# Patient Record
Sex: Female | Born: 1995 | Race: Black or African American | Hispanic: No | Smoking: Never smoker
Health system: Southern US, Community
[De-identification: ages and names within clinical notes are randomized; demographics above are authoritative.]

## PROBLEM LIST (undated history)

## (undated) ENCOUNTER — Ambulatory Visit: Admission: EM | Payer: BC Managed Care – PPO | Source: Home / Self Care

## (undated) DIAGNOSIS — F53 Postpartum depression: Secondary | ICD-10-CM

## (undated) DIAGNOSIS — D649 Anemia, unspecified: Secondary | ICD-10-CM

## (undated) HISTORY — PX: WISDOM TOOTH EXTRACTION: SHX21

## (undated) HISTORY — DX: Anemia, unspecified: D64.9

## (undated) HISTORY — DX: Postpartum depression: F53.0

---

## 2017-06-05 ENCOUNTER — Ambulatory Visit (INDEPENDENT_AMBULATORY_CARE_PROVIDER_SITE_OTHER): Payer: BLUE CROSS/BLUE SHIELD | Admitting: Family Medicine

## 2017-06-05 ENCOUNTER — Encounter: Payer: Self-pay | Admitting: Family Medicine

## 2017-06-05 VITALS — BP 120/82 | HR 94 | Temp 98.7°F | Resp 18 | Ht 64.49 in | Wt 131.4 lb

## 2017-06-05 DIAGNOSIS — J029 Acute pharyngitis, unspecified: Secondary | ICD-10-CM

## 2017-06-05 DIAGNOSIS — N926 Irregular menstruation, unspecified: Secondary | ICD-10-CM | POA: Diagnosis not present

## 2017-06-05 LAB — POCT URINE PREGNANCY: Preg Test, Ur: NEGATIVE

## 2017-06-05 LAB — POCT RAPID STREP A (OFFICE): Rapid Strep A Screen: POSITIVE — AB

## 2017-06-05 MED ORDER — AMOXICILLIN 500 MG PO CAPS: 500.0000 mg | ORAL_CAPSULE | Freq: Two times a day (BID) | ORAL | 0 refills | Status: AC

## 2017-06-05 MED ORDER — AMOXICILLIN 500 MG PO CAPS: 500.0000 mg | ORAL_CAPSULE | Freq: Two times a day (BID) | ORAL | 0 refills | Status: DC

## 2017-06-05 NOTE — Progress Notes (Signed)
   9/6/20183:56 PM  Cynthia Waters 12/24/1995, 21 y.o. female [id number]  Chief Complaint  Patient presents with  . Sore Throat    X 2 days    HPI:   Patient is a 21 y.o. female with past medical history significant for recurring strep  throat who presents today for 2 days of sore throat, no fever, no cough.   Also reports her period is late by about 3 days despite taking her OCPs as rx. She denies missing any pills.   Depression screen PHQ 2/9 06/05/2017  Decreased Interest 0  Down, Depressed, Hopeless 0  PHQ - 2 Score 0    No Known Allergies  No current outpatient prescriptions on file prior to visit.   No current facility-administered medications on file prior to visit.     History reviewed. No pertinent past medical history.  Past Surgical History:  Procedure Laterality Date  . WISDOM TOOTH EXTRACTION      Social History  Substance Use Topics  . Smoking status: Never Smoker  . Smokeless tobacco: Never Used  . Alcohol use No    History reviewed. No pertinent family history.  Review of Systems  Constitutional: Negative for chills and fever.  HENT: Positive for sore throat. Negative for congestion, ear pain and sinus pain.   Respiratory: Negative for cough and shortness of breath.   Gastrointestinal: Negative for abdominal pain, nausea and vomiting.     OBJECTIVE:  Blood pressure 120/82, pulse 94, temperature 98.7 F (37.1 C), temperature source Oral, resp. rate 18, height 5' 4.49" (1.638 m), weight 131 lb 6.4 oz (59.6 kg), last menstrual period 05/01/2017, SpO2 98 %.  Physical Exam  Constitutional: She is oriented to person, place, and time and well-developed, well-nourished, and in no distress.  HENT:  Head: Normocephalic and atraumatic.  Right Ear: Hearing, tympanic membrane, external ear and ear canal normal.  Left Ear: Hearing, tympanic membrane, external ear and ear canal normal.  Mouth/Throat: Mucous membranes are normal. Oropharyngeal exudate  present.  Eyes: Pupils are equal, round, and reactive to light. EOM are normal.  Neck: Neck supple.  Cardiovascular: Normal rate, regular rhythm and normal heart sounds.  Exam reveals no gallop and no friction rub.   No murmur heard. Pulmonary/Chest: Effort normal and breath sounds normal. She has no wheezes. She has no rales.  Lymphadenopathy:    She has cervical adenopathy.  Neurological: She is alert and oriented to person, place, and time. Gait normal.  Skin: Skin is warm and dry.    Results for orders placed or performed in visit on 06/05/17  POCT rapid strep A  Result Value Ref Range   Rapid Strep A Screen Positive (A) Negative  POCT urine pregnancy  Result Value Ref Range   Preg Test, Ur Negative Negative     ASSESSMENT and PLAN:  1. Sore throat Rapid strep test positive. Rx for amoxicillin sent to pharmacy of choice. Routine instructions and precautions discussed.  - POCT rapid strep A  2. Missed period Negative pregnancy test. Advised repeating in 1 week if still has not had her menses. - POCT urine pregnancy       Cynthia LippsIrma M Santiago, MD [facility] [address] [place], [place] [address] Ph.  [phone] Fax [phone]

## 2017-06-05 NOTE — Patient Instructions (Addendum)
   IF you received an x-ray today, you will receive an invoice from [facility]. Please contact [facility] at [phone] with questions or concerns regarding your invoice.   IF you received labwork today, you will receive an invoice from [facility]. Please contact [facility] at [phone] with questions or concerns regarding your invoice.   Our billing staff will not be able to assist you with questions regarding bills from these companies.  You will be contacted with the lab results as soon as they are available. The fastest way to get your results is to activate your My Chart account. Instructions are located on the last page of this paperwork. If you have not heard from us regarding the results in 2 weeks, please contact this office.     Strep Throat Strep throat is a bacterial infection of the throat. Your health care provider may call the infection tonsillitis or pharyngitis, depending on whether there is swelling in the tonsils or at the back of the throat. Strep throat is most common during the cold months of the year in children who are 5-15 years of age, but it can happen during any season in people of any age. This infection is spread from person to person (contagious) through coughing, sneezing, or close contact. What are the causes? Strep throat is caused by the bacteria called Streptococcus pyogenes. What increases the risk? This condition is more likely to develop in:  People who spend time in crowded places where the infection can spread easily.  People who have close contact with someone who has strep throat.  What are the signs or symptoms? Symptoms of this condition include:  Fever or chills.  Redness, swelling, or pain in the tonsils or throat.  Pain or difficulty when swallowing.  White or yellow spots on the tonsils or throat.  Swollen, tender glands in the neck or under the jaw.  Red rash all over the body (rare).  How is this  diagnosed? This condition is diagnosed by performing a rapid strep test or by taking a swab of your throat (throat culture test). Results from a rapid strep test are usually ready in a few minutes, but throat culture test results are available after one or two days. How is this treated? This condition is treated with antibiotic medicine. Follow these instructions at home: Medicines  Take over-the-counter and prescription medicines only as told by your health care provider.  Take your antibiotic as told by your health care provider. Do not stop taking the antibiotic even if you start to feel better.  Have family members who also have a sore throat or fever tested for strep throat. They may need antibiotics if they have the strep infection. Eating and drinking  Do not share food, drinking cups, or personal items that could cause the infection to spread to other people.  If swallowing is difficult, try eating soft foods until your sore throat feels better.  Drink enough fluid to keep your urine clear or pale yellow. General instructions  Gargle with a salt-water mixture 3-4 times per day or as needed. To make a salt-water mixture, completely dissolve -1 tsp of salt in 1 cup of warm water.  Make sure that all household members wash their hands well.  Get plenty of rest.  Stay home from school or work until you have been taking antibiotics for 24 hours.  Keep all follow-up visits as told by your health care provider. This is important. Contact a health care provider   if:  The glands in your neck continue to get bigger.  You develop a rash, cough, or earache.  You cough up a thick liquid that is green, yellow-brown, or bloody.  You have pain or discomfort that does not get better with medicine.  Your problems seem to be getting worse rather than better.  You have a fever. Get help right away if:  You have new symptoms, such as vomiting, severe headache, stiff or painful neck,  chest pain, or shortness of breath.  You have severe throat pain, drooling, or changes in your voice.  You have swelling of the neck, or the skin on the neck becomes red and tender.  You have signs of dehydration, such as fatigue, dry mouth, and decreased urination.  You become increasingly sleepy, or you cannot wake up completely.  Your joints become red or painful. This information is not intended to replace advice given to you by your health care provider. Make sure you discuss any questions you have with your health care provider. Document Released: 09/13/2000 Document Revised: 05/15/2016 Document Reviewed: 01/09/2015 Elsevier Interactive Patient Education  2017 Elsevier Inc.  

## 2017-06-09 ENCOUNTER — Ambulatory Visit: Payer: BLUE CROSS/BLUE SHIELD | Admitting: Family Medicine

## 2017-07-14 ENCOUNTER — Ambulatory Visit (HOSPITAL_COMMUNITY)
Admission: EM | Admit: 2017-07-14 | Discharge: 2017-07-14 | Disposition: A | Discharge status: Home / Self Care | Payer: Medicaid Other | Attending: Emergency Medicine | Admitting: Emergency Medicine

## 2017-07-14 ENCOUNTER — Encounter (HOSPITAL_COMMUNITY): Payer: Self-pay | Admitting: *Deleted

## 2017-07-14 DIAGNOSIS — M79672 Pain in left foot: Secondary | ICD-10-CM | POA: Diagnosis not present

## 2017-07-14 MED ORDER — NAPROXEN 500 MG PO TABS
500.0000 mg | ORAL_TABLET | Freq: Two times a day (BID) | ORAL | 0 refills | Status: AC
Start: 2017-07-14 — End: 2017-07-24

## 2017-07-14 NOTE — ED Provider Notes (Signed)
[facility]    CSN: [id number] Arrival date & time: 07/14/17  1421     History   Chief Complaint Chief Complaint  Patient presents with  . Foot Pain    HPI Cynthia Waters is a 21 y.o. female.   21 year old female comes in for 3 day history of left heel pain. Denies injury. Patient states pain is located at the medial aspect of the left heel/plantar surface and describes it as a sharp stabbing pain. She states it is worse with walking/standing and after rest. She noticed mild swelling that resolved after elevation. 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 She wears crocs as work with some support. Denies erythema, increased warmth. Has not tried anything for the pain.       History reviewed. No pertinent past medical history.  There are no active problems to display for this patient.   Past Surgical History:  Procedure Laterality Date  . WISDOM TOOTH EXTRACTION      OB History    No data available       Home Medications    Prior to Admission medications   Medication Sig Start Date End Date Taking? Authorizing Provider  norethindrone-ethinyl estradiol 1/35 (ORTHO-NOVUM, NORTREL,CYCLAFEM) tablet Take 1 tablet by mouth daily.   Yes [provider]  naproxen (NAPROSYN) 500 MG tablet Take 1 tablet (500 mg total) by mouth 2 (two) times daily. 07/14/17 07/24/17  Belinda Fisher, PA-C    Family History History reviewed. No pertinent family history.  Social History Social History  Substance Use Topics  . Smoking status: Never Smoker  . Smokeless tobacco: Never Used  . Alcohol use No     Allergies   Patient has no known allergies.   Review of Systems Review of Systems  Reason unable to perform ROS: See HPI as above.     Physical Exam Triage Vital Signs ED Triage Vitals  Enc Vitals Group     BP 07/14/17 1511 109/74     Pulse Rate 07/14/17 1511 60     Resp 07/14/17 1511 17     Temp 07/14/17 1511  98.7 F (37.1 C)     Temp Source 07/14/17 1511 Oral     SpO2 07/14/17 1511 100 %     Weight --      Height --      Head Circumference --      Peak Flow --      Pain Score 07/14/17 1510 7     Pain Loc --      Pain Edu? --      Excl. in GC? --    No data found.   Updated Vital Signs BP 109/74 (BP Location: Left Arm)   Pulse 60   Temp 98.7 F (37.1 C) (Oral)   Resp 17   SpO2 100%   Physical Exam  Constitutional: She is oriented to person, place, and time. She appears well-developed and well-nourished. No distress.  HENT:  Head: Normocephalic and atraumatic.  Eyes: Pupils are equal, round, and reactive to light. Conjunctivae are normal.  Musculoskeletal:  No contusion, swelling, erythema, increased warmth noted. Tenderness on palpation of medial aspect of left heel and plantar surface/arch. Full ROM of ankle and toes. Strength normal and equal bilaterally. Sensation intact and equal bilaterally.   Pedal pulses 2+ and equal bilaterally. Cap refill unable to assess due to nail polish. Gait normal.   Neurological: She  is alert and oriented to person, place, and time.     UC Treatments / Results  Labs (all labs ordered are listed, but only abnormal results are displayed) Labs Reviewed - No data to display  EKG  EKG Interpretation None       Radiology No results found.  Procedures Procedures (including critical care time)  Medications Ordered in UC Medications - No data to display   Initial Impression / Assessment and Plan / UC Course  I have reviewed the triage vital signs and the nursing notes.  Pertinent labs & imaging results that were available during my care of the patient were reviewed by me and considered in my medical decision making (see chart for details).    Discussed with patient history and exam most consistent with inflammation such as plantar fasciitis. Start NSAIDs. Discussed RICE. Patient to wear more supportive shoes. Follow up with  PCP/orthopedics if symptoms worsens or does not improve. Information given.  Final Clinical Impressions(s) / UC Diagnoses   Final diagnoses:  Left foot pain    New Prescriptions Discharge Medication List as of 07/14/2017  4:26 PM    START taking these medications   Details  naproxen (NAPROSYN) 500 MG tablet Take 1 tablet (500 mg total) by mouth 2 (two) times daily., Starting Mon 07/14/2017, Until Thu 07/24/2017, Normal           Linward Headland V, PA-C 07/14/17 1646

## 2017-07-14 NOTE — Discharge Instructions (Signed)
History and exam most consistent with inflammation. Start naproxen as directed. Ice compress and elevation. Wear supportive shoes. Follow exercises on attachment. I have attached [facility] and wellness information for PCP establishment. Also attached information for orthopedics if symptoms not improving for further evaluation and treatment needed.

## 2017-07-14 NOTE — ED Triage Notes (Signed)
Patient reports sharp pain to lateral aspect of left heel x 3 days. Denies any injury.

## 2017-07-25 ENCOUNTER — Telehealth (HOSPITAL_COMMUNITY): Payer: Self-pay | Admitting: Emergency Medicine

## 2017-07-25 ENCOUNTER — Encounter (HOSPITAL_COMMUNITY): Payer: Self-pay | Admitting: *Deleted

## 2017-07-25 ENCOUNTER — Ambulatory Visit (HOSPITAL_COMMUNITY)
Admission: EM | Admit: 2017-07-25 | Discharge: 2017-07-25 | Disposition: A | Discharge status: Home / Self Care | Payer: 59 | Attending: Emergency Medicine | Admitting: Emergency Medicine

## 2017-07-25 DIAGNOSIS — L292 Pruritus vulvae: Secondary | ICD-10-CM | POA: Insufficient documentation

## 2017-07-25 DIAGNOSIS — Z79899 Other long term (current) drug therapy: Secondary | ICD-10-CM | POA: Diagnosis not present

## 2017-07-25 DIAGNOSIS — N898 Other specified noninflammatory disorders of vagina: Secondary | ICD-10-CM

## 2017-07-25 MED ORDER — METRONIDAZOLE 0.75 % VA GEL: 1.0000 | Freq: Every day | VAGINAL | 0 refills | Status: AC

## 2017-07-25 MED ORDER — FLUCONAZOLE 200 MG PO TABS: 200.0000 mg | ORAL_TABLET | Freq: Every day | ORAL | 0 refills | Status: AC

## 2017-07-25 MED ORDER — METRONIDAZOLE 500 MG PO TABS: 500.0000 mg | ORAL_TABLET | Freq: Two times a day (BID) | ORAL | 0 refills | Status: DC

## 2017-07-25 NOTE — Telephone Encounter (Signed)
Pt seen here today by Barron AlvineNatalie B, NP ... Given Metrogel but Medicaid will not cover Rx  Per Dorene GrebeNatalie, ok to call in Flagyl 500 mg BID x7 days #14 no refills  [facility]

## 2017-07-25 NOTE — ED Triage Notes (Signed)
Patient reports history of bacterial vaginosis, yeast infections, and UTIs. States she has vaginal discharge and would like to know if it is yeast or BV. Patient tries to let the BV go away on its own per recommendation by her PCP.

## 2017-07-25 NOTE — Telephone Encounter (Signed)
Pt called... Was seen here earlier by Linus MakoNatalie Burky, NP  Sts Medicaid will not cover Metrogel and wants us to call in Flagyl  Per Barron AlvineNatalie B, NP... Ok to call in Flagyl 500 mg BID x7 days.   [facility] to [facility] [place]

## 2017-07-25 NOTE — ED Provider Notes (Signed)
[facility]    CSN: [id number] Arrival date & time: 07/25/17  1330     History   Chief Complaint Chief Complaint  Patient presents with  . Vaginal Discharge    HPI Cynthia Waters is a 21 y.o. female.   Cynthia Waters presents with complaints of vaginal discharge and itching which started a few weeks ago at completion of course of amoxicillin for strep throat. She was diagnosed with either BV or yeast infection at that time which improved with treatment which she is not sure of. Her symptoms returned shortly later, followed by resolution without treatment. They have since returned over the past few days. Without abdominal pain, odor, fevers chills, gi complaints or urinary symptoms. Has not tried any treatments for symptoms. She is sexually active, uses protection, states she was screened for STI's in September of this year, with low suspicion of STI's at this time.    ROS per HPI.       History reviewed. No pertinent past medical history.  There are no active problems to display for this patient.   Past Surgical History:  Procedure Laterality Date  . WISDOM TOOTH EXTRACTION      OB History    No data available       Home Medications    Prior to Admission medications   Medication Sig Start Date End Date Taking? Authorizing Provider  fluconazole (DIFLUCAN) 200 MG tablet Take 1 tablet (200 mg total) by mouth daily. Once today and again in 5days 07/25/17 07/27/17  Linus Mako B, NP  metroNIDAZOLE (METROGEL VAGINAL) 0.75 % vaginal gel Place 1 Applicatorful vaginally at bedtime. 07/25/17 07/30/17  Linus Mako B, NP  norethindrone-ethinyl estradiol 1/35 (ORTHO-NOVUM, NORTREL,CYCLAFEM) tablet Take 1 tablet by mouth daily.    [provider]    Family History History reviewed. No pertinent family history.  Social History Social History  Substance Use Topics  . Smoking status: Never Smoker  . Smokeless tobacco: Never Used  . Alcohol use No      Allergies   Patient has no known allergies.   Review of Systems Review of Systems   Physical Exam Triage Vital Signs ED Triage Vitals [07/25/17 1352]  Enc Vitals Group     BP 102/64     Pulse Rate 92     Resp 16     Temp 98.5 F (36.9 C)     Temp Source Oral     SpO2 100 %     Weight      Height      Head Circumference      Peak Flow      Pain Score      Pain Loc      Pain Edu?      Excl. in GC?    No data found.   Updated Vital Signs BP 102/64 (BP Location: Left Arm)   Pulse 92   Temp 98.5 F (36.9 C) (Oral)   Resp 16   SpO2 100%   Visual Acuity Right Eye Distance:   Left Eye Distance:   Bilateral Distance:    Right Eye Near:   Left Eye Near:    Bilateral Near:     Physical Exam  Constitutional: She appears well-developed and well-nourished.  Cardiovascular: Normal rate and regular rhythm.   Pulmonary/Chest: Effort normal and breath sounds normal.  Abdominal: Soft. There is no tenderness. There is no guarding.  Genitourinary: There is no rash, tenderness or lesion on the right labia. There  is no rash, tenderness or lesion on the left labia.  Genitourinary Comments: Thin white discharge coating vulva on external exam; internal exam deferred at this time  Vitals reviewed.    UC Treatments / Results  Labs (all labs ordered are listed, but only abnormal results are displayed) Labs Reviewed  CERVICOVAGINAL ANCILLARY ONLY    EKG  EKG Interpretation None       Radiology No results found.  Procedures Procedures (including critical care time)  Medications Ordered in UC Medications - No data to display   Initial Impression / Assessment and Plan / UC Course  I have reviewed the triage vital signs and the nursing notes.  Pertinent labs & imaging results that were available during my care of the patient were reviewed by me and considered in my medical decision making (see chart for details).     Treatment for both yeast and bv at  this time, as patient had been on antibiotics, but has also suffered from BV in the past as well. Will notify patient of positive test findings. Discussed vulvovaginal skin care for prevention, may try using daily probiotic. If symptoms worsen or do not improve in the next week to return to be seen or to follow up with PCP. Patient verbalized understanding and agreeable to plan.    Georgetta HaberNatalie B Anaisa Radi, NP 07/25/2017 2:18 PM   Final Clinical Impressions(s) / UC Diagnoses   Final diagnoses:  Vaginal discharge  Vaginal itching    New Prescriptions New Prescriptions   FLUCONAZOLE (DIFLUCAN) 200 MG TABLET    Take 1 tablet (200 mg total) by mouth daily. Once today and again in 5days   METRONIDAZOLE (METROGEL VAGINAL) 0.75 % VAGINAL GEL    Place 1 Applicatorful vaginally at bedtime.     Controlled Substance Prescriptions [place] Controlled Substance Registry consulted? Not Applicable   Georgetta HaberBurky, Jair Lindblad B, NP 07/25/17 1419

## 2017-07-25 NOTE — Discharge Instructions (Signed)
Take yeast medication today. Use intravaginal BV treatment starting tonight, ever night for the next 5 nights. Take second yeast medication at completion of cream. Continue to follow up with your primary care provider if your symptoms do not improve with these treatments.

## 2017-07-28 LAB — CERVICOVAGINAL ANCILLARY ONLY
Bacterial vaginitis: NEGATIVE
CHLAMYDIA, DNA PROBE: NEGATIVE
Candida vaginitis: POSITIVE — AB
NEISSERIA GONORRHEA: NEGATIVE
Trichomonas: NEGATIVE

## 2017-10-15 ENCOUNTER — Other Ambulatory Visit: Payer: Self-pay

## 2017-10-15 ENCOUNTER — Ambulatory Visit (HOSPITAL_COMMUNITY)
Admission: EM | Admit: 2017-10-15 | Discharge: 2017-10-15 | Disposition: A | Discharge status: Home / Self Care | Payer: Managed Care, Other (non HMO) | Attending: Family Medicine | Admitting: Family Medicine

## 2017-10-15 ENCOUNTER — Encounter (HOSPITAL_COMMUNITY): Payer: Self-pay | Admitting: Emergency Medicine

## 2017-10-15 DIAGNOSIS — R202 Paresthesia of skin: Secondary | ICD-10-CM

## 2017-10-15 DIAGNOSIS — R200 Anesthesia of skin: Secondary | ICD-10-CM

## 2017-10-15 LAB — POCT URINALYSIS DIP (DEVICE)
BILIRUBIN URINE: NEGATIVE
Glucose, UA: NEGATIVE mg/dL
KETONES UR: NEGATIVE mg/dL
Leukocytes, UA: NEGATIVE
Nitrite: NEGATIVE
PH: 6.5 (ref 5.0–8.0)
Protein, ur: NEGATIVE mg/dL
SPECIFIC GRAVITY, URINE: 1.025 (ref 1.005–1.030)
Urobilinogen, UA: 0.2 mg/dL (ref 0.0–1.0)

## 2017-10-15 LAB — POCT I-STAT, CHEM 8
BUN: 8 mg/dL (ref 6–20)
CALCIUM ION: 1.22 mmol/L (ref 1.15–1.40)
CHLORIDE: 103 mmol/L (ref 101–111)
Creatinine, Ser: 0.9 mg/dL (ref 0.44–1.00)
Glucose, Bld: 94 mg/dL (ref 65–99)
HCT: 37 % (ref 36.0–46.0)
Hemoglobin: 12.6 g/dL (ref 12.0–15.0)
Potassium: 4.1 mmol/L (ref 3.5–5.1)
SODIUM: 139 mmol/L (ref 135–145)
TCO2: 25 mmol/L (ref 22–32)

## 2017-10-15 NOTE — Discharge Instructions (Signed)
You may take 500mg  Tylenol every 6 hours with naproxen 400-500mg  every 12 hours for pain and inflammation. Try this for at least a week to see if your symptoms improve.

## 2017-10-15 NOTE — ED Provider Notes (Addendum)
  MRN: [id number] DOB: 10/10/1995  Subjective:   Cynthia Waters is a 22 y.o. female presenting for 2 week history of persistent numbness and tingling of her feet. Patient wore new pair of shoes on New Year's Eve. She has since had these symptoms. Has not tried any medications for her symptoms. Denies similar symptoms in hands. Her family history is positive for diabetes in her uncle, states that he just had a foot amputated and is very worried about diabetes with her symptoms. Denies polydipsia. States that she pees frequently but drinks water all day. Denies blurred vision, skin infections. Denies smoking cigarettes.  Cynthia Waters has No Known Allergies.  Cynthia Waters denies past medical history. Also  has a past surgical history that includes Wisdom tooth extraction.   Objective:   Vitals: BP 113/79 (BP Location: Left Arm)   Pulse 74   Temp 98.6 F (37 C) (Oral)   Resp 18   LMP 09/24/2017   SpO2 99%    Physical Exam  Constitutional: She is oriented to person, place, and time. She appears well-developed and well-nourished.  Cardiovascular: Normal rate.  Pulmonary/Chest: Effort normal.  Musculoskeletal:       Right foot: There is normal range of motion, no tenderness, no bony tenderness, no swelling, normal capillary refill, no crepitus, no deformity and no laceration.       Left foot: There is normal range of motion, no tenderness, no bony tenderness, no swelling, normal capillary refill, no crepitus, no deformity and no laceration.  Soles of feet with adequate sensation for soft and sharp touch. However, patient described pins and needles sensation with each time she was tested for her middle toes and mid distal foot.  Neurological: She is alert and oriented to person, place, and time.  Skin: Skin is warm and dry.  Psychiatric: She has a normal mood and affect.   Results for orders placed or performed during the hospital encounter of 10/15/17 (from the past 24 hour(s))  POCT urinalysis dip (device)      Status: Abnormal   Collection Time: 10/15/17  5:16 PM  Result Value Ref Range   Glucose, UA NEGATIVE NEGATIVE mg/dL   Bilirubin Urine NEGATIVE NEGATIVE   Ketones, ur NEGATIVE NEGATIVE mg/dL   Specific Gravity, Urine 1.025 1.005 - 1.030   Hgb urine dipstick TRACE (A) NEGATIVE   pH 6.5 5.0 - 8.0   Protein, ur NEGATIVE NEGATIVE mg/dL   Urobilinogen, UA 0.2 0.0 - 1.0 mg/dL   Nitrite NEGATIVE NEGATIVE   Leukocytes, UA NEGATIVE NEGATIVE  I-STAT, chem 8     Status: None   Collection Time: 10/15/17  5:23 PM  Result Value Ref Range   Sodium 139 135 - 145 mmol/L   Potassium 4.1 3.5 - 5.1 mmol/L   Chloride 103 101 - 111 mmol/L   BUN 8 6 - 20 mg/dL   Creatinine, Ser 4.090.90 0.44 - 1.00 mg/dL   Glucose, Bld 94 65 - 99 mg/dL   Calcium, Ion 8.111.22 9.141.15 - 1.40 mmol/L   TCO2 25 22 - 32 mmol/L   Hemoglobin 12.6 12.0 - 15.0 g/dL   HCT 78.237.0 95.636.0 - 21.346.0 %   Assessment and Plan :   Numbness of toes  Numbness and tingling  Reassured patient about her labs. Will manage conservatively with NSAID, shoes with adequate space for her toes. Return-to-clinic precautions discussed, patient verbalized understanding. Consider lumbar/sacral x-ray if symptoms persist.    Wallis BambergMani, Malakie Balis, PA-C 10/15/17 1733

## 2017-10-15 NOTE — ED Triage Notes (Signed)
Patient noticed specific toes on each foot are tingling since wearing a certain pair of high heels at new years.  Initially all toes were tingling.  Now there are 2 toes on right foot and 2 toes on left foot that are still tingling

## 2018-02-19 ENCOUNTER — Encounter (HOSPITAL_COMMUNITY): Payer: Self-pay | Admitting: Emergency Medicine

## 2018-02-19 ENCOUNTER — Ambulatory Visit (HOSPITAL_COMMUNITY)
Admission: EM | Admit: 2018-02-19 | Discharge: 2018-02-19 | Disposition: A | Discharge status: Home / Self Care | Payer: Managed Care, Other (non HMO) | Attending: Family Medicine | Admitting: Family Medicine

## 2018-02-19 DIAGNOSIS — Z79899 Other long term (current) drug therapy: Secondary | ICD-10-CM | POA: Diagnosis not present

## 2018-02-19 DIAGNOSIS — Z3202 Encounter for pregnancy test, result negative: Secondary | ICD-10-CM | POA: Insufficient documentation

## 2018-02-19 DIAGNOSIS — N898 Other specified noninflammatory disorders of vagina: Secondary | ICD-10-CM | POA: Insufficient documentation

## 2018-02-19 DIAGNOSIS — N912 Amenorrhea, unspecified: Secondary | ICD-10-CM | POA: Diagnosis not present

## 2018-02-19 LAB — POCT URINALYSIS DIP (DEVICE)
BILIRUBIN URINE: NEGATIVE
GLUCOSE, UA: NEGATIVE mg/dL
LEUKOCYTES UA: NEGATIVE
Nitrite: NEGATIVE
Protein, ur: NEGATIVE mg/dL
SPECIFIC GRAVITY, URINE: 1.02 (ref 1.005–1.030)
Urobilinogen, UA: 0.2 mg/dL (ref 0.0–1.0)
pH: 7 (ref 5.0–8.0)

## 2018-02-19 LAB — POCT PREGNANCY, URINE: Preg Test, Ur: NEGATIVE

## 2018-02-19 NOTE — Discharge Instructions (Signed)
Please follow-up with women's clinic for further evaluation of your lack of menstrual cycle  We are testing you for Gonorrhea, Chlamydia, Trichomonas, Yeast and Bacterial Vaginosis. We will call you if anything is positive and let you know if you require any further treatment. Please inform partners of any positive results.   Please return if symptoms not improving with treatment, development of fever, nausea, vomiting, abdominal pain.

## 2018-02-19 NOTE — ED Triage Notes (Signed)
PT reports no menstrual cycle for 3 months. PT is on birth control and is supposed to have monthly periods. PT had a negative home preg test a few weeks ago.

## 2018-02-19 NOTE — ED Provider Notes (Signed)
[facility]    CSN: [id number] Arrival date & time: 02/19/18  1926     History   Chief Complaint Chief Complaint  Patient presents with  . Possible Pregnancy    HPI Cynthia Waters is a 22 y.o. female no contributing past medical history presenting today for evaluation of lack of menstrual cycle.  Patient states that she has not had her menstrual cycle in 3 months.  Patient takes oral contraceptives and typically has 7 days of bleeding, bleeding typically heavy in the beginning.  Patient is sexually active, concerned about pregnancy despite being on birth control.  Also noting to have vaginal discharge.  Denies any associated itching or irritation.  Denies odor.  Denies dysuria, increased frequency.  Does note increased urgency, believes this was related to drinking a lot of water.  Denies fever, nausea, vomiting, abdominal pain.  Patient from [facility]  HPI  History reviewed. No pertinent past medical history.  There are no active problems to display for this patient.   Past Surgical History:  Procedure Laterality Date  . WISDOM TOOTH EXTRACTION      OB History   None      Home Medications    Prior to Admission medications   Medication Sig Start Date End Date Taking? Authorizing Provider  norethindrone-ethinyl estradiol 1/35 (ORTHO-NOVUM, NORTREL,CYCLAFEM) tablet Take 1 tablet by mouth daily.   Yes [provider]  metroNIDAZOLE (FLAGYL) 500 MG tablet Take 1 tablet (500 mg total) by mouth 2 (two) times daily. 07/25/17   Georgetta Haber, NP    Family History No family history on file.  Social History Social History   Tobacco Use  . Smoking status: Never Smoker  . Smokeless tobacco: Never Used  Substance Use Topics  . Alcohol use: No  . Drug use: No     Allergies   Patient has no known allergies.   Review of Systems Review of Systems  Constitutional: Negative for fever.  Respiratory: Negative for shortness of breath.     Cardiovascular: Negative for chest pain.  Gastrointestinal: Negative for abdominal pain, diarrhea, nausea and vomiting.  Genitourinary: Positive for urgency and vaginal discharge. Negative for dysuria, flank pain, genital sores, hematuria, menstrual problem, vaginal bleeding and vaginal pain.  Musculoskeletal: Negative for back pain.  Skin: Negative for rash.  Neurological: Negative for dizziness, light-headedness and headaches.     Physical Exam Triage Vital Signs ED Triage Vitals  Enc Vitals Group     BP 02/19/18 1944 115/79     Pulse Rate 02/19/18 1944 82     Resp 02/19/18 1944 16     Temp 02/19/18 1944 98.3 F (36.8 C)     Temp Source 02/19/18 1944 Oral     SpO2 02/19/18 1944 97 %     Weight 02/19/18 1943 134 lb (60.8 kg)     Height --      Head Circumference --      Peak Flow --      Pain Score 02/19/18 1943 0     Pain Loc --      Pain Edu? --      Excl. in GC? --    No data found.  Updated Vital Signs BP 115/79   Pulse 82   Temp 98.3 F (36.8 C) (Oral)   Resp 16   Wt 134 lb (60.8 kg)   LMP 12/10/2017   SpO2 97%   BMI 22.65 kg/m   Visual Acuity Right Eye Distance:  Left Eye Distance:   Bilateral Distance:    Right Eye Near:   Left Eye Near:    Bilateral Near:     Physical Exam  Constitutional: She appears well-developed and well-nourished. No distress.  HENT:  Head: Normocephalic and atraumatic.  Eyes: Conjunctivae are normal.  Neck: Neck supple.  Cardiovascular: Normal rate and regular rhythm.  No murmur heard. Pulmonary/Chest: Effort normal and breath sounds normal. No respiratory distress.  Abdominal: Soft. There is no tenderness.  Nontender light and deep palpation  Genitourinary:  Genitourinary Comments: Deferred  Musculoskeletal: She exhibits no edema.  Neurological: She is alert.  Skin: Skin is warm and dry.  Psychiatric: She has a normal mood and affect.  Nursing note and vitals reviewed.    UC Treatments / Results  Labs (all  labs ordered are listed, but only abnormal results are displayed) Labs Reviewed  CERVICOVAGINAL ANCILLARY ONLY    EKG None  Radiology No results found.  Procedures Procedures (including critical care time)  Medications Ordered in UC Medications - No data to display  Initial Impression / Assessment and Plan / UC Course  I have reviewed the triage vital signs and the nursing notes.  Pertinent labs & imaging results that were available during my care of the patient were reviewed by me and considered in my medical decision making (see chart for details).     Pregnancy test negative, UA unremarkable.  Will have patient continue oral contraceptives, follow-up with women's clinic for further evaluation.  Vaginal swab obtained to evaluate vaginal discharge.Discussed strict return precautions. Patient verbalized understanding and is agreeable with plan.  Final Clinical Impressions(s) / UC Diagnoses   Final diagnoses:  Amenorrhea     Discharge Instructions     Please follow-up with women's clinic for further evaluation of your lack of menstrual cycle  We are testing you for Gonorrhea, Chlamydia, Trichomonas, Yeast and Bacterial Vaginosis. We will call you if anything is positive and let you know if you require any further treatment. Please inform partners of any positive results.   Please return if symptoms not improving with treatment, development of fever, nausea, vomiting, abdominal pain.     ED Prescriptions    None     Controlled Substance Prescriptions [place] Controlled Substance Registry consulted? Not Applicable   Lew Dawes, [place] 02/19/18 2055

## 2018-02-20 LAB — CERVICOVAGINAL ANCILLARY ONLY
Bacterial vaginitis: POSITIVE — AB
Candida vaginitis: NEGATIVE
Chlamydia: NEGATIVE
Neisseria Gonorrhea: NEGATIVE
Trichomonas: NEGATIVE

## 2018-02-21 ENCOUNTER — Telehealth (HOSPITAL_COMMUNITY): Payer: Self-pay

## 2018-02-21 NOTE — Telephone Encounter (Signed)
Bacterial vaginosis is positive. This was not treated at the urgent care visit.  Pt denies any symptoms at this time.

## 2018-03-12 ENCOUNTER — Ambulatory Visit (HOSPITAL_COMMUNITY)
Admission: EM | Admit: 2018-03-12 | Discharge: 2018-03-12 | Disposition: A | Discharge status: Home / Self Care | Payer: Managed Care, Other (non HMO) | Attending: Family Medicine | Admitting: Family Medicine

## 2018-03-12 ENCOUNTER — Encounter (HOSPITAL_COMMUNITY): Payer: Self-pay

## 2018-03-12 DIAGNOSIS — N898 Other specified noninflammatory disorders of vagina: Secondary | ICD-10-CM

## 2018-03-12 DIAGNOSIS — Z9889 Other specified postprocedural states: Secondary | ICD-10-CM | POA: Diagnosis not present

## 2018-03-12 DIAGNOSIS — Z792 Long term (current) use of antibiotics: Secondary | ICD-10-CM | POA: Diagnosis not present

## 2018-03-12 DIAGNOSIS — Z793 Long term (current) use of hormonal contraceptives: Secondary | ICD-10-CM | POA: Insufficient documentation

## 2018-03-12 MED ORDER — METRONIDAZOLE 500 MG PO TABS: 500.0000 mg | ORAL_TABLET | Freq: Two times a day (BID) | ORAL | 0 refills | Status: AC

## 2018-03-12 NOTE — ED Triage Notes (Signed)
Pt was here 3 weeks ago for no period in awhile, she was diagnosed with BV but did not have any symptoms so did not get treatment. She now is having vaginal itching, discharge with a foul odor.

## 2018-03-12 NOTE — Discharge Instructions (Signed)
Will notify you of any positive findings and if any changes to treatment are needed.   We will start treatment for BV at this time. Please withhold from intercourse for the next week. Please use condoms to prevent STD's.   Don't drink alcohol while on this medication. If symptoms worsen or do not improve in the next week to return to be seen or to follow up with your PCP.

## 2018-03-12 NOTE — ED Provider Notes (Signed)
[facility]    CSN: [id number] Arrival date & time: 03/12/18  1831     History   Chief Complaint Chief Complaint  Patient presents with  . Vaginal Itching    HPI Cynthia Waters is a 22 y.o. female.   Cynthia Waters presents with complaints of vaginal itching, odor and creamy discharge which started approximately 4 days ago. Was seen here 5/23 for late period and was found to have BV. At that time she was asymptomatic therefore did not start treatment. Denies abdominal pain, back pain or fevers. Has since been sexually active, does not use condoms. Has has BV in the past and this has felt similar. She is on birth control. Denies std concerns. Has since hs her period, was 6/10, light. She states normal for her. Without contributing medical history.     ROS per HPI.      History reviewed. No pertinent past medical history.  There are no active problems to display for this patient.   Past Surgical History:  Procedure Laterality Date  . WISDOM TOOTH EXTRACTION      OB History   None      Home Medications    Prior to Admission medications   Medication Sig Start Date End Date Taking? Authorizing Provider  norethindrone-ethinyl estradiol 1/35 (ORTHO-NOVUM, NORTREL,CYCLAFEM) tablet Take 1 tablet by mouth daily.   Yes [provider]  metroNIDAZOLE (FLAGYL) 500 MG tablet Take 1 tablet (500 mg total) by mouth 2 (two) times daily for 7 days. 03/12/18 03/19/18  Georgetta HaberBurky, Denae Zulueta B, NP    Family History No family history on file.  Social History Social History   Tobacco Use  . Smoking status: Never Smoker  . Smokeless tobacco: Never Used  Substance Use Topics  . Alcohol use: No  . Drug use: No     Allergies   Patient has no known allergies.   Review of Systems Review of Systems   Physical Exam Triage Vital Signs ED Triage Vitals  Enc Vitals Group     BP 03/12/18 1859 120/81     Pulse Rate 03/12/18 1859 63     Resp 03/12/18 1859 18     Temp  03/12/18 1859 98.9 F (37.2 C)     Temp src --      SpO2 03/12/18 1859 99 %     Weight --      Height --      Head Circumference --      Peak Flow --      Pain Score 03/12/18 1900 0     Pain Loc --      Pain Edu? --      Excl. in GC? --    No data found.  Updated Vital Signs BP 120/81   Pulse 63   Temp 98.9 F (37.2 C)   Resp 18   LMP 03/09/2018   SpO2 99%    Physical Exam  Constitutional: She is oriented to person, place, and time. She appears well-developed and well-nourished. No distress.  Cardiovascular: Normal rate, regular rhythm and normal heart sounds.  Pulmonary/Chest: Effort normal and breath sounds normal.  Abdominal: Soft. She exhibits no distension. There is no tenderness. There is no rigidity, no rebound, no guarding and no CVA tenderness.  Genitourinary:  Genitourinary Comments: Denies sores lesions, pain  Neurological: She is alert and oriented to person, place, and time.  Skin: Skin is warm and dry.     UC Treatments / Results  Labs (all  labs ordered are listed, but only abnormal results are displayed) Labs Reviewed  URINE CYTOLOGY ANCILLARY ONLY    EKG None  Radiology No results found.  Procedures Procedures (including critical care time)  Medications Ordered in UC Medications - No data to display  Initial Impression / Assessment and Plan / UC Course  I have reviewed the triage vital signs and the nursing notes.  Pertinent labs & imaging results that were available during my care of the patient were reviewed by me and considered in my medical decision making (see chart for details).     Urine cytology pending to ensure remains bv and no additional yeast or std presence. Starting on flagyl today. Will notify of any positive findings and if any changes to treatment are needed.  Patient verbalized understanding and agreeable to plan.   Final Clinical Impressions(s) / UC Diagnoses   Final diagnoses:  Vaginal discharge     Discharge  Instructions     Will notify you of any positive findings and if any changes to treatment are needed.   We will start treatment for BV at this time. Please withhold from intercourse for the next week. Please use condoms to prevent STD's.   Don't drink alcohol while on this medication. If symptoms worsen or do not improve in the next week to return to be seen or to follow up with your PCP.      ED Prescriptions    Medication Sig Dispense Auth. Provider   metroNIDAZOLE (FLAGYL) 500 MG tablet Take 1 tablet (500 mg total) by mouth 2 (two) times daily for 7 days. 14 tablet Georgetta Haber, NP     Controlled Substance Prescriptions [place] Controlled Substance Registry consulted? Not Applicable   Georgetta Haber, NP 03/12/18 1920

## 2018-03-13 LAB — URINE CYTOLOGY ANCILLARY ONLY
Chlamydia: NEGATIVE
NEISSERIA GONORRHEA: NEGATIVE
Trichomonas: NEGATIVE

## 2018-03-13 LAB — POCT PREGNANCY, URINE: Preg Test, Ur: NEGATIVE

## 2018-03-16 LAB — URINE CYTOLOGY ANCILLARY ONLY
BACTERIAL VAGINITIS: POSITIVE — AB
Candida vaginitis: NEGATIVE

## 2018-03-17 ENCOUNTER — Telehealth (HOSPITAL_COMMUNITY): Payer: Self-pay

## 2018-03-17 NOTE — Telephone Encounter (Signed)
Bacterial Vaginosis test is positive.  Prescription for metronidazole was given at the urgent care visit. Pt contacted regarding results. Answered all questions. Verbalized understanding.   

## 2018-09-08 DIAGNOSIS — N898 Other specified noninflammatory disorders of vagina: Secondary | ICD-10-CM | POA: Diagnosis not present

## 2018-09-08 DIAGNOSIS — N760 Acute vaginitis: Secondary | ICD-10-CM | POA: Diagnosis not present

## 2019-01-28 DIAGNOSIS — Z6821 Body mass index (BMI) 21.0-21.9, adult: Secondary | ICD-10-CM | POA: Diagnosis not present

## 2019-01-28 DIAGNOSIS — Z803 Family history of malignant neoplasm of breast: Secondary | ICD-10-CM | POA: Diagnosis not present

## 2019-01-28 DIAGNOSIS — Z1331 Encounter for screening for depression: Secondary | ICD-10-CM | POA: Diagnosis not present

## 2019-04-05 DIAGNOSIS — Z20828 Contact with and (suspected) exposure to other viral communicable diseases: Secondary | ICD-10-CM | POA: Diagnosis not present

## 2019-06-08 DIAGNOSIS — S5012XA Contusion of left forearm, initial encounter: Secondary | ICD-10-CM | POA: Diagnosis not present

## 2019-06-08 DIAGNOSIS — S62307A Unspecified fracture of fifth metacarpal bone, left hand, initial encounter for closed fracture: Secondary | ICD-10-CM | POA: Diagnosis not present

## 2019-06-10 ENCOUNTER — Other Ambulatory Visit: Payer: Self-pay | Admitting: Orthopedic Surgery

## 2019-06-10 ENCOUNTER — Other Ambulatory Visit: Payer: Self-pay

## 2019-06-10 ENCOUNTER — Encounter (HOSPITAL_BASED_OUTPATIENT_CLINIC_OR_DEPARTMENT_OTHER): Payer: Self-pay | Admitting: *Deleted

## 2019-06-10 DIAGNOSIS — S62327A Displaced fracture of shaft of fifth metacarpal bone, left hand, initial encounter for closed fracture: Secondary | ICD-10-CM | POA: Diagnosis not present
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
## 2019-06-11 ENCOUNTER — Other Ambulatory Visit (HOSPITAL_COMMUNITY)
Admission: RE | Admit: 2019-06-11 | Discharge: 2019-06-11 | Disposition: A | Discharge status: Home / Self Care | Payer: BC Managed Care – PPO | Source: Ambulatory Visit | Attending: Orthopedic Surgery | Admitting: Orthopedic Surgery

## 2019-06-11 DIAGNOSIS — Z20828 Contact with and (suspected) exposure to other viral communicable diseases: Secondary | ICD-10-CM | POA: Insufficient documentation

## 2019-06-11 DIAGNOSIS — Z01812 Encounter for preprocedural laboratory examination: Secondary | ICD-10-CM | POA: Diagnosis not present

## 2019-06-11 LAB — SARS CORONAVIRUS 2 (TAT 6-24 HRS): SARS Coronavirus 2: NEGATIVE

## 2019-06-13 NOTE — Anesthesia Preprocedure Evaluation (Addendum)
Anesthesia Evaluation  Patient identified by MRN, date of birth, ID band Patient awake    Reviewed: Allergy & Precautions, NPO status , Patient's Chart, lab work & pertinent test results  History of Anesthesia Complications Negative for: history of anesthetic complications  Airway Mallampati: II  TM Distance: >3 FB Neck ROM: Full    Dental no notable dental hx.    Pulmonary neg pulmonary ROS,    Pulmonary exam normal        Cardiovascular negative cardio ROS Normal cardiovascular exam     Neuro/Psych negative neurological ROS  negative psych ROS   GI/Hepatic negative GI ROS, Neg liver ROS,   Endo/Other  negative endocrine ROS  Renal/GU negative Renal ROS  negative genitourinary   Musculoskeletal negative musculoskeletal ROS (+)   Abdominal   Peds  Hematology negative hematology ROS (+)   Anesthesia Other Findings Day of surgery medications reviewed with patient.  Reproductive/Obstetrics negative OB ROS                            Anesthesia Physical Anesthesia Plan  ASA: I  Anesthesia Plan: Regional   Post-op Pain Management:    Induction:   PONV Risk Score and Plan: 2 and Treatment may vary due to age or medical condition, Ondansetron, Dexamethasone, Propofol infusion and Midazolam  Airway Management Planned: Natural Airway and Simple Face Mask  Additional Equipment: None  Intra-op Plan:   Post-operative Plan:   Informed Consent: I have reviewed the patients History and Physical, chart, labs and discussed the procedure including the risks, benefits and alternatives for the proposed anesthesia with the patient or authorized representative who has indicated his/her understanding and acceptance.     Dental advisory given  Plan Discussed with: CRNA  Anesthesia Plan Comments:        Anesthesia Quick Evaluation

## 2019-06-14 ENCOUNTER — Ambulatory Visit (HOSPITAL_BASED_OUTPATIENT_CLINIC_OR_DEPARTMENT_OTHER): Payer: BC Managed Care – PPO | Admitting: Anesthesiology

## 2019-06-14 ENCOUNTER — Ambulatory Visit (HOSPITAL_BASED_OUTPATIENT_CLINIC_OR_DEPARTMENT_OTHER)
Admission: RE | Admit: 2019-06-14 | Discharge: 2019-06-14 | Disposition: A | Discharge status: Home / Self Care | Payer: BC Managed Care – PPO | Attending: Orthopedic Surgery | Admitting: Orthopedic Surgery

## 2019-06-14 ENCOUNTER — Ambulatory Visit (HOSPITAL_COMMUNITY): Payer: BC Managed Care – PPO

## 2019-06-14 ENCOUNTER — Encounter (HOSPITAL_BASED_OUTPATIENT_CLINIC_OR_DEPARTMENT_OTHER): Payer: Self-pay | Admitting: Certified Registered"

## 2019-06-14 ENCOUNTER — Encounter (HOSPITAL_BASED_OUTPATIENT_CLINIC_OR_DEPARTMENT_OTHER)
Admission: RE | Disposition: A | Discharge status: Home / Self Care | Payer: Self-pay | Source: Home / Self Care | Attending: Orthopedic Surgery

## 2019-06-14 ENCOUNTER — Other Ambulatory Visit: Payer: Self-pay

## 2019-06-14 DIAGNOSIS — Z419 Encounter for procedure for purposes other than remedying health state, unspecified: Secondary | ICD-10-CM

## 2019-06-14 DIAGNOSIS — Y9389 Activity, other specified: Secondary | ICD-10-CM | POA: Diagnosis not present

## 2019-06-14 DIAGNOSIS — S62397D Other fracture of fifth metacarpal bone, left hand, subsequent encounter for fracture with routine healing: Secondary | ICD-10-CM | POA: Diagnosis not present

## 2019-06-14 DIAGNOSIS — W228XXA Striking against or struck by other objects, initial encounter: Secondary | ICD-10-CM | POA: Insufficient documentation

## 2019-06-14 DIAGNOSIS — S62327A Displaced fracture of shaft of fifth metacarpal bone, left hand, initial encounter for closed fracture: Secondary | ICD-10-CM | POA: Insufficient documentation

## 2019-06-14 DIAGNOSIS — S62307D Unspecified fracture of fifth metacarpal bone, left hand, subsequent encounter for fracture with routine healing: Secondary | ICD-10-CM | POA: Diagnosis not present

## 2019-06-14 HISTORY — PX: OPEN REDUCTION INTERNAL FIXATION (ORIF) METACARPAL: SHX6234

## 2019-06-14 LAB — POCT PREGNANCY, URINE: Preg Test, Ur: NEGATIVE

## 2019-06-14 SURGERY — OPEN REDUCTION INTERNAL FIXATION (ORIF) METACARPAL
Anesthesia: Regional | Site: Hand | Laterality: Left

## 2019-06-14 MED ORDER — MIDAZOLAM HCL 2 MG/2ML IJ SOLN
INTRAMUSCULAR | Status: AC
  Filled 2019-06-14: qty 2

## 2019-06-14 MED ORDER — ACETAMINOPHEN 325 MG PO TABS: 650.0000 mg | ORAL_TABLET | Freq: Four times a day (QID) | ORAL | Status: DC

## 2019-06-14 MED ORDER — IBUPROFEN 200 MG PO TABS: 600.0000 mg | ORAL_TABLET | Freq: Four times a day (QID) | ORAL | 0 refills | Status: DC

## 2019-06-14 MED ORDER — ACETAMINOPHEN 500 MG PO TABS
ORAL_TABLET | ORAL | Status: AC
  Filled 2019-06-14: qty 2

## 2019-06-14 MED ORDER — OXYCODONE HCL 5 MG PO TABS: 5.0000 mg | ORAL_TABLET | Freq: Four times a day (QID) | ORAL | 0 refills | Status: DC | PRN

## 2019-06-14 MED ORDER — ACETAMINOPHEN 500 MG PO TABS
1000.0000 mg | ORAL_TABLET | Freq: Once | ORAL | Status: AC
  Administered 2019-06-14: 1000 mg via ORAL

## 2019-06-14 MED ORDER — PROPOFOL 10 MG/ML IV BOLUS
INTRAVENOUS | Status: DC | PRN
  Administered 2019-06-14: 20 mg via INTRAVENOUS
  Administered 2019-06-14: 10 mg via INTRAVENOUS

## 2019-06-14 MED ORDER — MIDAZOLAM HCL 2 MG/2ML IJ SOLN
1.0000 mg | INTRAMUSCULAR | Status: DC | PRN
  Administered 2019-06-14: 2 mg via INTRAVENOUS

## 2019-06-14 MED ORDER — BUPIVACAINE-EPINEPHRINE (PF) 0.5% -1:200000 IJ SOLN
INTRAMUSCULAR | Status: DC | PRN
  Administered 2019-06-14: 30 mL via PERINEURAL

## 2019-06-14 MED ORDER — FENTANYL CITRATE (PF) 100 MCG/2ML IJ SOLN
50.0000 ug | INTRAMUSCULAR | Status: DC | PRN
  Administered 2019-06-14: 50 ug via INTRAVENOUS

## 2019-06-14 MED ORDER — CEFAZOLIN SODIUM-DEXTROSE 2-4 GM/100ML-% IV SOLN
2.0000 g | INTRAVENOUS | Status: AC
  Administered 2019-06-14: 2 g via INTRAVENOUS

## 2019-06-14 MED ORDER — FENTANYL CITRATE (PF) 100 MCG/2ML IJ SOLN
INTRAMUSCULAR | Status: AC
  Filled 2019-06-14: qty 2

## 2019-06-14 MED ORDER — PROPOFOL 500 MG/50ML IV EMUL
INTRAVENOUS | Status: DC | PRN
  Administered 2019-06-14: 75 ug/kg/min via INTRAVENOUS

## 2019-06-14 MED ORDER — LACTATED RINGERS IV SOLN
INTRAVENOUS | Status: DC
  Administered 2019-06-14: 08:00:00 via INTRAVENOUS

## 2019-06-14 MED ORDER — POVIDONE-IODINE 10 % EX SWAB
2.0000 "application " | Freq: Once | CUTANEOUS | Status: AC
  Administered 2019-06-14: 2 via TOPICAL

## 2019-06-14 MED ORDER — ONDANSETRON HCL 4 MG/2ML IJ SOLN
INTRAMUSCULAR | Status: DC | PRN
  Administered 2019-06-14: 4 mg via INTRAVENOUS

## 2019-06-14 MED ORDER — CEFAZOLIN SODIUM-DEXTROSE 2-4 GM/100ML-% IV SOLN
INTRAVENOUS | Status: AC
  Filled 2019-06-14: qty 100

## 2019-06-14 SURGICAL SUPPLY — 62 items
BENZOIN TINCTURE PRP APPL 2/3 (GAUZE/BANDAGES/DRESSINGS) ×3 IMPLANT
BIT DRILL 1.1 (BIT) ×1
BIT DRILL 1.1MM (BIT) ×1
BIT DRILL 60X20X1.1XQC TMX (BIT) ×1 IMPLANT
BIT DRL 60X20X1.1XQC TMX (BIT) ×1
BLADE MINI RND TIP GREEN BEAV (BLADE) IMPLANT
BLADE SURG 15 STRL LF DISP TIS (BLADE) ×1 IMPLANT
BLADE SURG 15 STRL SS (BLADE) ×2
BNDG COHESIVE 4X5 TAN STRL (GAUZE/BANDAGES/DRESSINGS) ×3 IMPLANT
BNDG ESMARK 4X9 LF (GAUZE/BANDAGES/DRESSINGS) ×3 IMPLANT
BNDG GAUZE ELAST 4 BULKY (GAUZE/BANDAGES/DRESSINGS) ×6 IMPLANT
CANISTER SUCTION 1200CC (MISCELLANEOUS) ×3 IMPLANT
CHLORAPREP W/TINT 26 (MISCELLANEOUS) ×3 IMPLANT
CLOSURE WOUND 1/2 X4 (GAUZE/BANDAGES/DRESSINGS) ×1
CORD BIPOLAR FORCEPS 12FT (ELECTRODE) ×3 IMPLANT
COVER BACK TABLE REUSABLE LG (DRAPES) ×3 IMPLANT
COVER MAYO STAND REUSABLE (DRAPES) ×3 IMPLANT
COVER WAND RF STERILE (DRAPES) IMPLANT
CUFF TOURN SGL QUICK 18X4 (TOURNIQUET CUFF) ×3 IMPLANT
DRAPE C-ARM 42X72 X-RAY (DRAPES) ×3 IMPLANT
DRAPE EXTREMITY T 121X128X90 (DISPOSABLE) ×3 IMPLANT
DRAPE SURG 17X23 STRL (DRAPES) ×3 IMPLANT
DRSG EMULSION OIL 3X3 NADH (GAUZE/BANDAGES/DRESSINGS) ×3 IMPLANT
GAUZE SPONGE 4X4 12PLY STRL LF (GAUZE/BANDAGES/DRESSINGS) ×3 IMPLANT
GLOVE BIO SURGEON STRL SZ7.5 (GLOVE) ×3 IMPLANT
GLOVE BIOGEL PI IND STRL 7.0 (GLOVE) ×2 IMPLANT
GLOVE BIOGEL PI IND STRL 8 (GLOVE) ×1 IMPLANT
GLOVE BIOGEL PI INDICATOR 7.0 (GLOVE) ×4
GLOVE BIOGEL PI INDICATOR 8 (GLOVE) ×2
GLOVE ECLIPSE 6.5 STRL STRAW (GLOVE) ×6 IMPLANT
GOWN STRL REUS W/ TWL LRG LVL3 (GOWN DISPOSABLE) ×4 IMPLANT
GOWN STRL REUS W/TWL LRG LVL3 (GOWN DISPOSABLE) ×8
GOWN STRL REUS W/TWL XL LVL3 (GOWN DISPOSABLE) IMPLANT
LOCK SCREW 1.5X15MM (Screw) ×6 IMPLANT
NEEDLE HYPO 25X1 1.5 SAFETY (NEEDLE) IMPLANT
NS IRRIG 1000ML POUR BTL (IV SOLUTION) ×3 IMPLANT
PACK BASIN DAY SURGERY FS (CUSTOM PROCEDURE TRAY) ×3 IMPLANT
PADDING CAST ABS 4INX4YD NS (CAST SUPPLIES)
PADDING CAST ABS COTTON 4X4 ST (CAST SUPPLIES) IMPLANT
PLATE TYL 1.5 (Plate) ×3 IMPLANT
SCREW L 1.5X12 (Screw) ×3 IMPLANT
SCREW L 1.5X14 (Screw) ×6 IMPLANT
SCREW LOCK 1.5X15MM (Screw) ×2 IMPLANT
SCREW LOCKING 1.5X11MM (Screw) ×6 IMPLANT
SCREW LOCKING 1.5X13MM (Screw) ×9 IMPLANT
SLEEVE SCD COMPRESS KNEE MED (MISCELLANEOUS) IMPLANT
SLING ARM FOAM STRAP MED (SOFTGOODS) ×3 IMPLANT
SPLINT PLASTER CAST XFAST 3X15 (CAST SUPPLIES) ×5 IMPLANT
SPLINT PLASTER XTRA FASTSET 3X (CAST SUPPLIES) ×10
STOCKINETTE 6  STRL (DRAPES) ×2
STOCKINETTE 6 STRL (DRAPES) ×1 IMPLANT
STRIP CLOSURE SKIN 1/2X4 (GAUZE/BANDAGES/DRESSINGS) ×2 IMPLANT
SUCTION FRAZIER HANDLE 10FR (MISCELLANEOUS) ×2
SUCTION TUBE FRAZIER 10FR DISP (MISCELLANEOUS) ×1 IMPLANT
SUT VICRYL RAPIDE 4-0 (SUTURE) IMPLANT
SUT VICRYL RAPIDE 4/0 PS 2 (SUTURE) ×3 IMPLANT
SYR 10ML LL (SYRINGE) IMPLANT
SYR BULB 3OZ (MISCELLANEOUS) ×3 IMPLANT
TOWEL GREEN STERILE FF (TOWEL DISPOSABLE) ×3 IMPLANT
TUBE CONNECTING 20'X1/4 (TUBING) ×1
TUBE CONNECTING 20X1/4 (TUBING) ×2 IMPLANT
UNDERPAD 30X36 HEAVY ABSORB (UNDERPADS AND DIAPERS) ×3 IMPLANT

## 2019-06-14 NOTE — Interval H&P Note (Signed)
History and Physical Interval Note:  06/14/2019 7:32 AM  Cynthia Waters  has presented today for surgery, with the diagnosis of LEFT FIFTH METACARPAL FRACTURE.  The various methods of treatment have been discussed with the patient and family. After consideration of risks, benefits and other options for treatment, the patient has consented to  Procedure(s) with comments: OPEN REDUCTION INTERNAL FIXATION (ORIF) LEFT FIFTH METACARPAL (Left) - WITH PREOP BLOCK as a surgical intervention.  The patient's history has been reviewed, patient examined, no change in status, stable for surgery.  I have reviewed the patient's chart and labs.  Questions were answered to the patient's satisfaction.     Jolyn Nap

## 2019-06-14 NOTE — Discharge Instructions (Signed)
Discharge Instructions   You have a dressing with a plaster splint incorporated in it. Move your fingers as much as possible, making a full fist and fully opening the fist. Elevate your hand to reduce pain & swelling of the digits.  Ice over the operative site may be helpful to reduce pain & swelling.  DO NOT USE HEAT. Pain medicine has been prescribed for you.  Take Tylenol 650 mg and Ibuprofen 600 mg every 6 hours together. Take Oxycodone 5 mg additionally for severe post operative pain. Leave the dressing in place until you return to our office.  You may shower, but keep the bandage clean & dry.  You may drive a car when you are off of prescription pain medications and can safely control your vehicle with both hands. Call our office to arrange a follow up appointment for 10-15 days from date of surgery.   No Tylenol until after 2pm.      Regional Anesthesia Blocks  1. Numbness or the inability to move the "blocked" extremity may last from 3-48 hours after placement. The length of time depends on the medication injected and your individual response to the medication. If the numbness is not going away after 48 hours, call your surgeon.  2. The extremity that is blocked will need to be protected until the numbness is gone and the  Strength has returned. Because you cannot feel it, you will need to take extra care to avoid injury. Because it may be weak, you may have difficulty moving it or using it. You may not know what position it is in without looking at it while the block is in effect.  3. For blocks in the legs and feet, returning to weight bearing and walking needs to be done carefully. You will need to wait until the numbness is entirely gone and the strength has returned. You should be able to move your leg and foot normally before you try and bear weight or walk. You will need someone to be with you when you first try to ensure you do not fall and possibly risk injury.  4.  Bruising and tenderness at the needle site are common side effects and will resolve in a few days.  5. Persistent numbness or new problems with movement should be communicated to the surgeon or the [facility] [phone] [place] [phone]).   Please call [phone] during normal business hours or [phone] after hours for any problems. Including the following:   - excessive redness of the incisions - drainage for more than 4 days - fever of more than 101.5 F  *Please note that pain medications will not be refilled after hours or on weekends.  WORK STATUS: You may return to work on 06/17/19 with no lifting, gripping, or grasping greater than pencil and paper tasks with the left hand.    Post Anesthesia Home Care Instructions  Activity: Get plenty of rest for the remainder of the day. A responsible individual must stay with you for 24 hours following the procedure.  For the next 24 hours, DO NOT: -Drive a car -Paediatric nurse -Drink alcoholic beverages -Take any medication unless instructed by your physician -Make any legal decisions or sign important papers.  Meals: Start with liquid foods such as gelatin or soup. Progress to regular foods as tolerated. Avoid greasy, spicy, heavy foods. If nausea and/or vomiting occur, drink only clear liquids until the nausea and/or vomiting subsides. Call your physician if vomiting continues.  Special Instructions/Symptoms: Your throat may feel dry or sore from the anesthesia or the breathing tube placed in your throat during surgery. If this causes discomfort, gargle with warm salt water. The discomfort should disappear within 24 hours.  If you had a scopolamine patch placed behind your ear for the management of post- operative nausea and/or vomiting:  1. The medication in the patch is effective for 72 hours, after which it should be removed.  Wrap patch in a tissue and discard in the trash. Wash  hands thoroughly with soap and water. 2. You may remove the patch earlier than 72 hours if you experience unpleasant side effects which may include dry mouth, dizziness or visual disturbances. 3. Avoid touching the patch. Wash your hands with soap and water after contact with the patch.

## 2019-06-14 NOTE — Transfer of Care (Signed)
Immediate Anesthesia Transfer of Care Note  Patient: Cynthia Waters  Procedure(s) Performed: OPEN REDUCTION INTERNAL FIXATION (ORIF) LEFT FIFTH METACARPAL (Left Hand)  Patient Location: PACU  Anesthesia Type:MAC combined with regional for post-op pain  Level of Consciousness: awake, alert  and oriented  Airway & Oxygen Therapy: Patient Spontanous Breathing and Patient connected to nasal cannula oxygen  Post-op Assessment: Report given to RN and Post -op Vital signs reviewed and stable  Post vital signs: Reviewed and stable  Last Vitals:  Vitals Value Taken Time  BP    Temp    Pulse 108 06/14/19 1123  Resp    SpO2 79 % 06/14/19 1123  Vitals shown include unvalidated device data.  Last Pain:  Vitals:   06/14/19 0759  TempSrc: Oral  PainSc: 0-No pain         Complications: No apparent anesthesia complications

## 2019-06-14 NOTE — Anesthesia Procedure Notes (Signed)
Anesthesia Regional Block: Supraclavicular block   Pre-Anesthetic Checklist: ,, timeout performed, Correct Patient, Correct Site, Correct Laterality, Correct Procedure, Correct Position, site marked, Risks and benefits discussed, pre-op evaluation,  At surgeon's request and post-op pain management  Laterality: Left  Prep: Maximum Sterile Barrier Precautions used, chloraprep       Needles:  Injection technique: Single-shot  Needle Type: Echogenic Stimulator Needle     Needle Length: 9cm  Needle Gauge: 22     Additional Needles:   Procedures:,,,, ultrasound used (permanent image in chart),,,,  Narrative:  Start time: 06/14/2019 8:50 AM End time: 06/14/2019 8:52 AM Injection made incrementally with aspirations every 5 mL.  Performed by: Personally  Anesthesiologist: Brennan Bailey, MD  Additional Notes: Risks, benefits, and alternative discussed. Patient gave consent for procedure. Patient prepped and draped in sterile fashion. Sedation administered, patient remains easily responsive to voice. Relevant anatomy identified with ultrasound guidance. Local anesthetic given in 5cc increments with no signs or symptoms of intravascular injection. No pain or paraesthesias with injection. Patient monitored throughout procedure with signs of LAST or immediate complications. Tolerated well. Ultrasound image placed in chart.  Tawny Asal, MD

## 2019-06-14 NOTE — Anesthesia Postprocedure Evaluation (Signed)
Anesthesia Post Note  Patient: Cynthia Waters  Procedure(s) Performed: OPEN REDUCTION INTERNAL FIXATION (ORIF) LEFT FIFTH METACARPAL (Left Hand)     Patient location during evaluation: PACU Anesthesia Type: Regional Level of consciousness: awake and alert and oriented Pain management: pain level controlled Vital Signs Assessment: post-procedure vital signs reviewed and stable Respiratory status: spontaneous breathing, nonlabored ventilation and respiratory function stable Cardiovascular status: blood pressure returned to baseline Postop Assessment: no apparent nausea or vomiting Anesthetic complications: no    Last Vitals:  Vitals:   06/14/19 0925 06/14/19 1123  BP:  (!) 111/94  Pulse: 82   Resp:  16  Temp:  36.8 C  SpO2: 100% 100%    Last Pain:  Vitals:   06/14/19 0759  TempSrc: Oral  PainSc: 0-No pain                 Brennan Bailey

## 2019-06-14 NOTE — Progress Notes (Signed)
Assisted Dr. Howze with left, ultrasound guided, supraclavicular block. Side rails up, monitors on throughout procedure. See vital signs in flow sheet. Tolerated Procedure well. 

## 2019-06-14 NOTE — Op Note (Signed)
06/14/2019  7:32 AM  PATIENT:  Cynthia Waters  23 y.o. female  PRE-OPERATIVE DIAGNOSIS:  Displaced left 5th MC shaft fx  POST-OPERATIVE DIAGNOSIS:  Same  PROCEDURE:  ORIF L 5th MC shaft fx  SURGEON: Rayvon Char. Grandville Silos, MD  PHYSICIAN ASSISTANT: Morley Kos, OPA-C  ANESTHESIA:  regional and MAC  SPECIMENS:  None  DRAINS:   None  EBL:  less than 50 mL  PREOPERATIVE INDICATIONS:  [name] is a  23 y.o. 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  OPERATIVE IMPLANTS: Biomet ALPS hand set 1.54m plate/screws  OPERATIVE PROCEDURE:  After receiving prophylactic antibiotics & a regional block,  the patient was escorted to the operative theatre and placed in a supine position.  A surgical "time-out" was performed during which the planned procedure, proposed operative site, and the correct patient identity were compared to the operative consent and agreement confirmed by the circulating nurse according to current facility policy.  Following application of a tourniquet to the operative extremity, the exposed skin was prepped with Chloraprep and draped in the usual sterile fashion.  The limb was exsanguinated with an Esmarch bandage and the tourniquet inflated to approximately 1055mg higher than systolic BP.  A linear longitudinal incision was marked and made dorsally over the fifth metacarpal.  Subcutaneous tissues were dissected with blunt spreading dissection.  The extensor tendons were retracted.  The fascia overlying the metacarpal was incised and reflected along with the periosteum to expose the fracture.  It was cleaned of debris and clot and provisionally  reduced and held with clamps.  The appropriate sized plate from the [employer] was selected and contoured to the metacarpal.  Holes were sequentially drilled and filled with appropriate length screws.  This was confirmed fluoroscopically.  Metacarpal head then placed, digital rotation was clinically checked and found to be acceptable.  The remainder were filled.  Final images were obtained and saved.  The wound was irrigated and the periosteum/fascia was reapproximated to cover the plate.  Tourniquet was released to distal hemostasis obtained with bipolar electrocautery and the skin was closed with 4-0 Vicryl Rapide running horizontal mattress suture.  A short arm bulky splint dressing was applied and she was taken to recovery room in stable condition.  DISPOSITION: She be discharged home today with typical instructions, returning in 10 to 15 days for reevaluation with new x-rays of the left hand out of splint, followed by conversion to removable Velcro splint plus/minus buddy taping.

## 2019-06-15 ENCOUNTER — Encounter (HOSPITAL_BASED_OUTPATIENT_CLINIC_OR_DEPARTMENT_OTHER): Payer: Self-pay | Admitting: Orthopedic Surgery

## 2019-06-24 DIAGNOSIS — M25642 Stiffness of left hand, not elsewhere classified: Secondary | ICD-10-CM | POA: Diagnosis not present

## 2019-06-24 DIAGNOSIS — S62307D Unspecified fracture of fifth metacarpal bone, left hand, subsequent encounter for fracture with routine healing: Secondary | ICD-10-CM | POA: Diagnosis not present

## 2019-06-24 DIAGNOSIS — S62327D Displaced fracture of shaft of fifth metacarpal bone, left hand, subsequent encounter for fracture with routine healing: Secondary | ICD-10-CM | POA: Diagnosis not present

## 2019-06-30 DIAGNOSIS — S62307D Unspecified fracture of fifth metacarpal bone, left hand, subsequent encounter for fracture with routine healing: Secondary | ICD-10-CM | POA: Diagnosis not present

## 2019-06-30 DIAGNOSIS — M25642 Stiffness of left hand, not elsewhere classified: Secondary | ICD-10-CM | POA: Diagnosis not present

## 2019-07-08 DIAGNOSIS — S62307D Unspecified fracture of fifth metacarpal bone, left hand, subsequent encounter for fracture with routine healing: Secondary | ICD-10-CM | POA: Diagnosis not present

## 2019-07-08 DIAGNOSIS — M25642 Stiffness of left hand, not elsewhere classified: Secondary | ICD-10-CM | POA: Diagnosis not present

## 2019-07-13 DIAGNOSIS — S62307D Unspecified fracture of fifth metacarpal bone, left hand, subsequent encounter for fracture with routine healing: Secondary | ICD-10-CM | POA: Diagnosis not present

## 2019-07-13 DIAGNOSIS — M6281 Muscle weakness (generalized): Secondary | ICD-10-CM | POA: Diagnosis not present

## 2019-07-15 DIAGNOSIS — M25642 Stiffness of left hand, not elsewhere classified: Secondary | ICD-10-CM | POA: Diagnosis not present

## 2019-07-15 DIAGNOSIS — S62307D Unspecified fracture of fifth metacarpal bone, left hand, subsequent encounter for fracture with routine healing: Secondary | ICD-10-CM | POA: Diagnosis not present

## 2019-07-22 DIAGNOSIS — S62327D Displaced fracture of shaft of fifth metacarpal bone, left hand, subsequent encounter for fracture with routine healing: Secondary | ICD-10-CM | POA: Diagnosis not present

## 2019-07-27 DIAGNOSIS — S62307D Unspecified fracture of fifth metacarpal bone, left hand, subsequent encounter for fracture with routine healing: Secondary | ICD-10-CM | POA: Diagnosis not present

## 2019-07-27 DIAGNOSIS — M25642 Stiffness of left hand, not elsewhere classified: Secondary | ICD-10-CM | POA: Diagnosis not present

## 2019-07-29 DIAGNOSIS — M25642 Stiffness of left hand, not elsewhere classified: Secondary | ICD-10-CM | POA: Diagnosis not present

## 2019-07-29 DIAGNOSIS — S62307D Unspecified fracture of fifth metacarpal bone, left hand, subsequent encounter for fracture with routine healing: Secondary | ICD-10-CM | POA: Diagnosis not present

## 2019-08-06 DIAGNOSIS — S62307D Unspecified fracture of fifth metacarpal bone, left hand, subsequent encounter for fracture with routine healing: Secondary | ICD-10-CM | POA: Diagnosis not present

## 2019-08-06 DIAGNOSIS — M25642 Stiffness of left hand, not elsewhere classified: Secondary | ICD-10-CM | POA: Diagnosis not present

## 2019-08-13 DIAGNOSIS — S62307D Unspecified fracture of fifth metacarpal bone, left hand, subsequent encounter for fracture with routine healing: Secondary | ICD-10-CM | POA: Diagnosis not present

## 2019-08-13 DIAGNOSIS — M25642 Stiffness of left hand, not elsewhere classified: Secondary | ICD-10-CM | POA: Diagnosis not present

## 2019-08-17 DIAGNOSIS — S62307D Unspecified fracture of fifth metacarpal bone, left hand, subsequent encounter for fracture with routine healing: Secondary | ICD-10-CM | POA: Diagnosis not present

## 2019-08-17 DIAGNOSIS — M25642 Stiffness of left hand, not elsewhere classified: Secondary | ICD-10-CM | POA: Diagnosis not present

## 2019-08-21 DIAGNOSIS — Z20828 Contact with and (suspected) exposure to other viral communicable diseases: Secondary | ICD-10-CM | POA: Diagnosis not present

## 2019-08-24 DIAGNOSIS — S62327D Displaced fracture of shaft of fifth metacarpal bone, left hand, subsequent encounter for fracture with routine healing: Secondary | ICD-10-CM | POA: Diagnosis not present

## 2019-08-31 DIAGNOSIS — M25642 Stiffness of left hand, not elsewhere classified: Secondary | ICD-10-CM | POA: Diagnosis not present

## 2019-08-31 DIAGNOSIS — S62307D Unspecified fracture of fifth metacarpal bone, left hand, subsequent encounter for fracture with routine healing: Secondary | ICD-10-CM | POA: Diagnosis not present

## 2019-09-27 DIAGNOSIS — Z20828 Contact with and (suspected) exposure to other viral communicable diseases: Secondary | ICD-10-CM | POA: Diagnosis not present

## 2019-10-05 DIAGNOSIS — S62327D Displaced fracture of shaft of fifth metacarpal bone, left hand, subsequent encounter for fracture with routine healing: Secondary | ICD-10-CM | POA: Diagnosis not present

## 2019-12-03 DIAGNOSIS — Z0000 Encounter for general adult medical examination without abnormal findings: Secondary | ICD-10-CM | POA: Diagnosis not present

## 2020-01-10 IMAGING — RF DG WRIST COMPLETE 3+V*L*
1 series · 3 of 3 positions shown · non-contrast
Comparison: None.

CLINICAL DATA: Portable imaging for ORIF of a fifth metacarpal
fracture.

EXAM:
LEFT WRIST - COMPLETE 3+ VIEW; DG C-ARM 1-60 MIN

[Series 1: run · 3 of 3 slices shown]
[im 1/3]
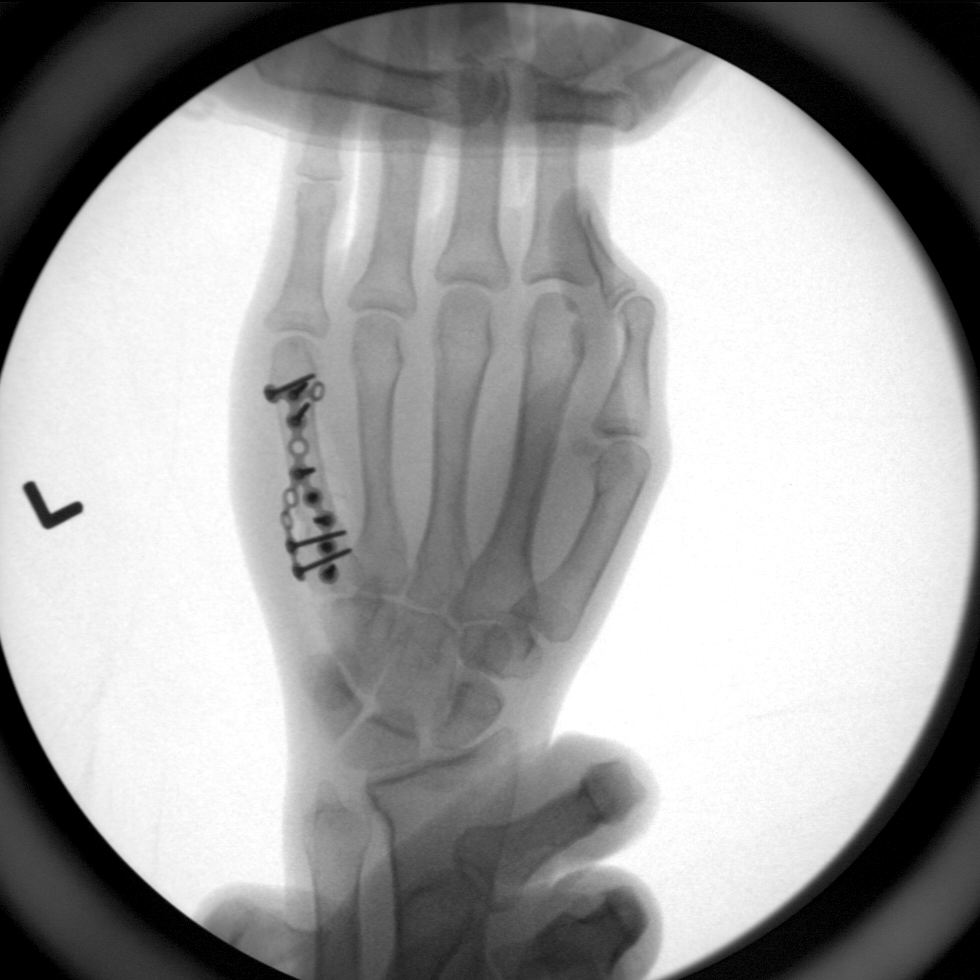
[im 2/3]
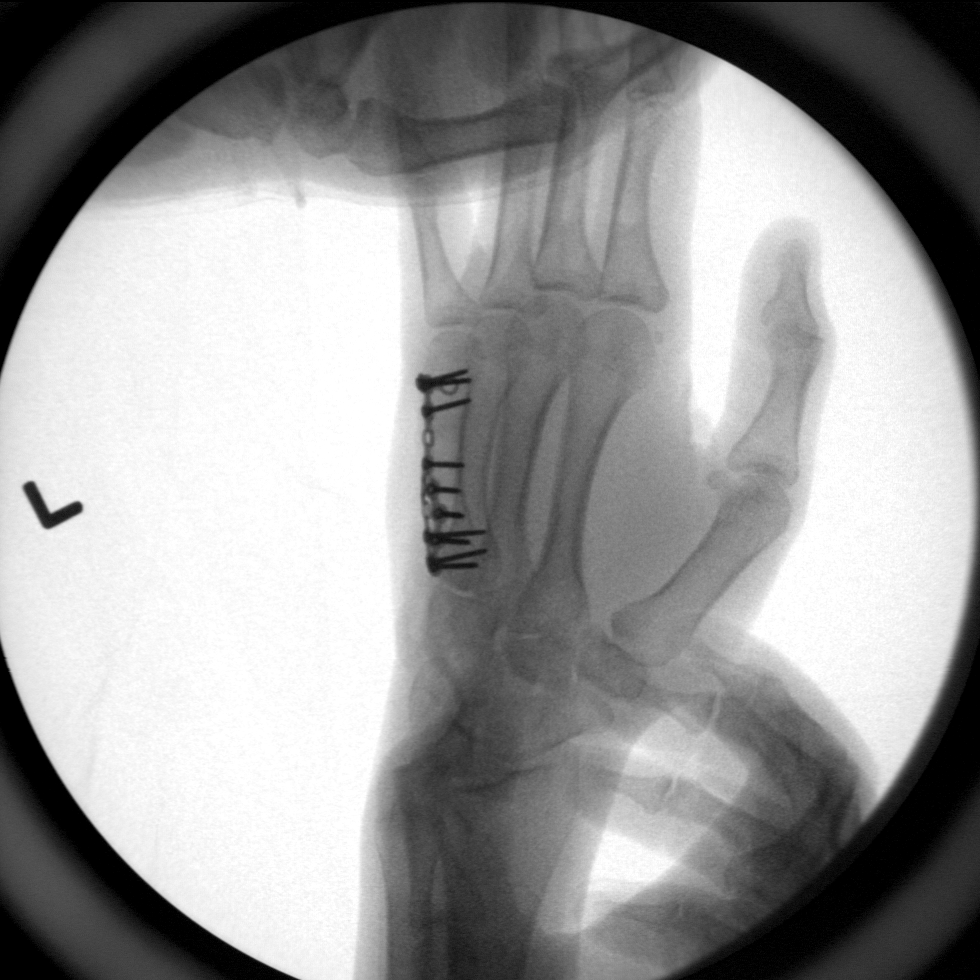
[im 3/3]
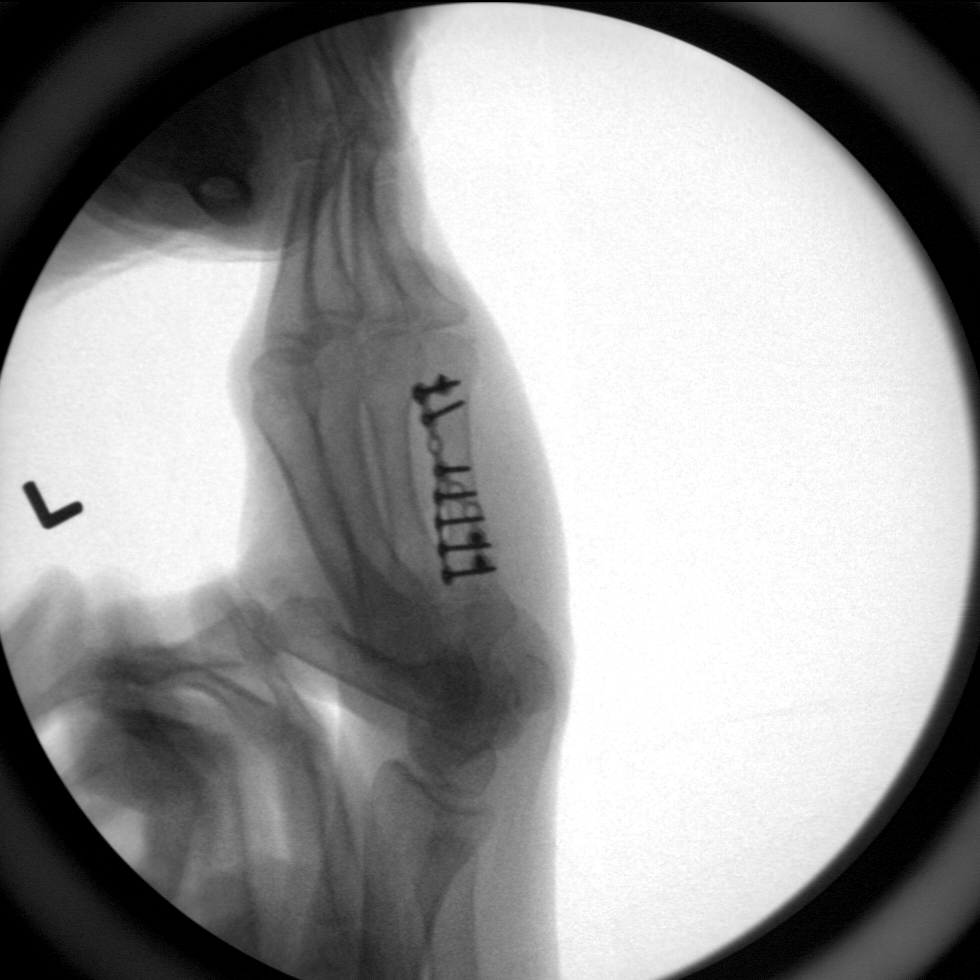

[3 of 3 positions shown; findings below may reference images not displayed]

FINDINGS: Submitted images show placement of screws and fixation plates across
the fifth left metacarpal reducing the fracture into near anatomic
alignment. Orthopedic hardware appears well seated.
IMPRESSION: Well aligned fracture following ORIF of the left fifth metacarpal.

## 2020-02-09 DIAGNOSIS — Z20828 Contact with and (suspected) exposure to other viral communicable diseases: Secondary | ICD-10-CM | POA: Diagnosis not present

## 2020-02-09 DIAGNOSIS — Z03818 Encounter for observation for suspected exposure to other biological agents ruled out: Secondary | ICD-10-CM | POA: Diagnosis not present

## 2020-02-11 DIAGNOSIS — Z3009 Encounter for other general counseling and advice on contraception: Secondary | ICD-10-CM | POA: Diagnosis not present

## 2020-02-11 DIAGNOSIS — R05 Cough: Secondary | ICD-10-CM | POA: Diagnosis not present

## 2020-10-07 DIAGNOSIS — Z20822 Contact with and (suspected) exposure to covid-19: Secondary | ICD-10-CM | POA: Diagnosis not present

## 2020-10-07 DIAGNOSIS — Z03818 Encounter for observation for suspected exposure to other biological agents ruled out: Secondary | ICD-10-CM | POA: Diagnosis not present

## 2020-12-04 DIAGNOSIS — Z113 Encounter for screening for infections with a predominantly sexual mode of transmission: Secondary | ICD-10-CM | POA: Diagnosis not present

## 2020-12-04 DIAGNOSIS — Z124 Encounter for screening for malignant neoplasm of cervix: Secondary | ICD-10-CM | POA: Diagnosis not present

## 2020-12-04 DIAGNOSIS — Z0000 Encounter for general adult medical examination without abnormal findings: Secondary | ICD-10-CM | POA: Diagnosis not present

## 2020-12-11 DIAGNOSIS — R87612 Low grade squamous intraepithelial lesion on cytologic smear of cervix (LGSIL): Secondary | ICD-10-CM | POA: Insufficient documentation

## 2021-11-15 ENCOUNTER — Other Ambulatory Visit: Payer: Self-pay

## 2021-11-15 ENCOUNTER — Emergency Department
Admission: EM | Admit: 2021-11-15 | Discharge: 2021-11-15 | Disposition: A | Discharge status: Home / Self Care | Payer: BLUE CROSS/BLUE SHIELD | Attending: Emergency Medicine | Admitting: Emergency Medicine

## 2021-11-15 ENCOUNTER — Emergency Department: Payer: BLUE CROSS/BLUE SHIELD

## 2021-11-15 ENCOUNTER — Encounter: Payer: Self-pay | Admitting: Emergency Medicine

## 2021-11-15 DIAGNOSIS — Z5321 Procedure and treatment not carried out due to patient leaving prior to being seen by health care provider: Secondary | ICD-10-CM | POA: Diagnosis not present

## 2021-11-15 DIAGNOSIS — R079 Chest pain, unspecified: Secondary | ICD-10-CM | POA: Insufficient documentation

## 2021-11-15 LAB — BASIC METABOLIC PANEL
Anion gap: 7 (ref 5–15)
BUN: 17 mg/dL (ref 6–20)
CO2: 25 mmol/L (ref 22–32)
Calcium: 9 mg/dL (ref 8.9–10.3)
Chloride: 105 mmol/L (ref 98–111)
Creatinine, Ser: 1.14 mg/dL — ABNORMAL HIGH (ref 0.44–1.00)
GFR, Estimated: >60 mL/min (ref >60)
Glucose, Bld: 96 mg/dL (ref 70–99)
Potassium: 3.7 mmol/L (ref 3.5–5.1)
Sodium: 137 mmol/L (ref 135–145)

## 2021-11-15 LAB — CBC
HCT: 37.1 % (ref 36.0–46.0)
Hemoglobin: 11.3 g/dL — ABNORMAL LOW (ref 12.0–15.0)
MCH: 26.7 pg (ref 26.0–34.0)
MCHC: 30.5 g/dL (ref 30.0–36.0)
MCV: 87.7 fL (ref 80.0–100.0)
Platelets: 369 10*3/uL (ref 150–400)
RBC: 4.23 MIL/uL (ref 3.87–5.11)
RDW: 14.4 % (ref 11.5–15.5)
WBC: 9.8 10*3/uL (ref 4.0–10.5)
nRBC: 0 % (ref 0.0–0.2)

## 2021-11-15 LAB — TROPONIN I (HIGH SENSITIVITY): Troponin I (High Sensitivity): <2 ng/L (ref <18)

## 2021-11-15 NOTE — ED Triage Notes (Signed)
Pt to ED from home c/o right side and right chest pain that started earlier today.  Denies n/v/d or SOB.  Denies blood thinners, birth control use, long trips, or hx of blood clots.  States recent pregnancy 1/31.  Pt A&Ox4, chest rise even and unlabored, skin WNL and in NAD at this time.

## 2022-02-22 ENCOUNTER — Emergency Department: Payer: BLUE CROSS/BLUE SHIELD

## 2022-02-22 ENCOUNTER — Encounter: Payer: Self-pay | Admitting: Intensive Care

## 2022-02-22 ENCOUNTER — Other Ambulatory Visit: Payer: Self-pay

## 2022-02-22 ENCOUNTER — Emergency Department
Admission: EM | Admit: 2022-02-22 | Discharge: 2022-02-23 | Disposition: A | Discharge status: Home / Self Care | Payer: BLUE CROSS/BLUE SHIELD | Attending: Emergency Medicine | Admitting: Emergency Medicine

## 2022-02-22 DIAGNOSIS — B348 Other viral infections of unspecified site: Secondary | ICD-10-CM | POA: Insufficient documentation

## 2022-02-22 DIAGNOSIS — R8271 Bacteriuria: Secondary | ICD-10-CM | POA: Diagnosis not present

## 2022-02-22 DIAGNOSIS — O26891 Other specified pregnancy related conditions, first trimester: Secondary | ICD-10-CM | POA: Diagnosis present

## 2022-02-22 DIAGNOSIS — O98511 Other viral diseases complicating pregnancy, first trimester: Secondary | ICD-10-CM | POA: Insufficient documentation

## 2022-02-22 DIAGNOSIS — Z3A01 Less than 8 weeks gestation of pregnancy: Secondary | ICD-10-CM | POA: Diagnosis not present

## 2022-02-22 DIAGNOSIS — Z20822 Contact with and (suspected) exposure to covid-19: Secondary | ICD-10-CM | POA: Diagnosis not present

## 2022-02-22 LAB — RESPIRATORY PANEL BY PCR

## 2022-02-22 LAB — URINALYSIS, ROUTINE W REFLEX MICROSCOPIC
Bilirubin Urine: NEGATIVE
Glucose, UA: NEGATIVE mg/dL
Hgb urine dipstick: NEGATIVE
Ketones, ur: NEGATIVE mg/dL
Nitrite: NEGATIVE
Protein, ur: NEGATIVE mg/dL
Specific Gravity, Urine: 1.001 — ABNORMAL LOW (ref 1.005–1.030)
pH: 8 (ref 5.0–8.0)

## 2022-02-22 LAB — CBC WITH DIFFERENTIAL/PLATELET
Abs Immature Granulocytes: 0.04 10*3/uL (ref 0.00–0.07)
Basophils Absolute: 0 10*3/uL (ref 0.0–0.1)
Basophils Relative: 0 %
Eosinophils Absolute: 0 10*3/uL (ref 0.0–0.5)
Eosinophils Relative: 0 %
HCT: 35.7 % — ABNORMAL LOW (ref 36.0–46.0)
Hemoglobin: 11.3 g/dL — ABNORMAL LOW (ref 12.0–15.0)
Immature Granulocytes: 0 %
Lymphocytes Relative: 6 %
Lymphs Abs: 0.8 10*3/uL (ref 0.7–4.0)
MCH: 26.5 pg (ref 26.0–34.0)
MCHC: 31.7 g/dL (ref 30.0–36.0)
MCV: 83.6 fL (ref 80.0–100.0)
Monocytes Absolute: 1 10*3/uL (ref 0.1–1.0)
Monocytes Relative: 8 %
Neutro Abs: 10.4 10*3/uL — ABNORMAL HIGH (ref 1.7–7.7)
Neutrophils Relative %: 86 %
Platelets: 307 10*3/uL (ref 150–400)
RBC: 4.27 MIL/uL (ref 3.87–5.11)
RDW: 12.7 % (ref 11.5–15.5)
WBC: 12.2 10*3/uL — ABNORMAL HIGH (ref 4.0–10.5)
nRBC: 0 % (ref 0.0–0.2)

## 2022-02-22 LAB — WET PREP, GENITAL
Clue Cells Wet Prep HPF POC: NONE SEEN
Sperm: NONE SEEN
Trich, Wet Prep: NONE SEEN
WBC, Wet Prep HPF POC: <10 (ref <10)
Yeast Wet Prep HPF POC: NONE SEEN

## 2022-02-22 LAB — GROUP A STREP BY PCR: Group A Strep by PCR: NOT DETECTED

## 2022-02-22 LAB — COMPREHENSIVE METABOLIC PANEL
ALT: 17 U/L (ref 0–44)
AST: 21 U/L (ref 15–41)
Albumin: 4.2 g/dL (ref 3.5–5.0)
Alkaline Phosphatase: 40 U/L (ref 38–126)
Anion gap: 9 (ref 5–15)
BUN: 6 mg/dL (ref 6–20)
CO2: 23 mmol/L (ref 22–32)
Calcium: 9.4 mg/dL (ref 8.9–10.3)
Chloride: 105 mmol/L (ref 98–111)
Creatinine, Ser: 0.76 mg/dL (ref 0.44–1.00)
GFR, Estimated: >60 mL/min (ref >60)
Glucose, Bld: 112 mg/dL — ABNORMAL HIGH (ref 70–99)
Potassium: 3.7 mmol/L (ref 3.5–5.1)
Sodium: 137 mmol/L (ref 135–145)
Total Bilirubin: 0.6 mg/dL (ref 0.3–1.2)
Total Protein: 7.9 g/dL (ref 6.5–8.1)

## 2022-02-22 LAB — RESP PANEL BY RT-PCR (FLU A&B, COVID) ARPGX2
Influenza A by PCR: NEGATIVE
Influenza B by PCR: NEGATIVE
SARS Coronavirus 2 by RT PCR: NEGATIVE

## 2022-02-22 LAB — CHLAMYDIA/NGC RT PCR (ARMC ONLY)
Chlamydia Tr: NOT DETECTED
N gonorrhoeae: NOT DETECTED

## 2022-02-22 LAB — POC URINE PREG, ED: Preg Test, Ur: POSITIVE — AB

## 2022-02-22 LAB — LACTIC ACID, PLASMA: Lactic Acid, Venous: 1.1 mmol/L (ref 0.5–1.9)

## 2022-02-22 LAB — HCG, QUANTITATIVE, PREGNANCY: hCG, Beta Chain, Quant, S: 2865 m[IU]/mL — ABNORMAL HIGH (ref <5)

## 2022-02-22 LAB — LIPASE, BLOOD: Lipase: 42 U/L (ref 11–51)

## 2022-02-22 MED ORDER — CEPHALEXIN 500 MG PO CAPS
500.0000 mg | ORAL_CAPSULE | Freq: Once | ORAL | Status: AC
  Administered 2022-02-22: 500 mg via ORAL
  Filled 2022-02-22: qty 1

## 2022-02-22 MED ORDER — CEPHALEXIN 250 MG PO CAPS: 250.0000 mg | ORAL_CAPSULE | Freq: Four times a day (QID) | ORAL | 0 refills | Status: AC

## 2022-02-22 MED ORDER — SODIUM CHLORIDE 0.9 % IV BOLUS
1000.0000 mL | Freq: Once | INTRAVENOUS | Status: AC
  Administered 2022-02-22: 1000 mL via INTRAVENOUS

## 2022-02-22 MED ORDER — AMOXICILLIN-POT CLAVULANATE 875-125 MG PO TABS: 1.0000 | ORAL_TABLET | Freq: Once | ORAL | Status: DC

## 2022-02-22 MED ORDER — ACETAMINOPHEN 500 MG PO TABS
1000.0000 mg | ORAL_TABLET | ORAL | Status: AC
  Administered 2022-02-22: 1000 mg via ORAL
  Filled 2022-02-22: qty 2

## 2022-02-22 MED ORDER — SODIUM CHLORIDE 0.9 % IV BOLUS
1000.0000 mL | Freq: Once | INTRAVENOUS | Status: DC
Start: 2022-02-22 — End: 2022-02-23

## 2022-02-22 NOTE — ED Notes (Signed)
Pt presents to the ED with fever, chills, and body aches that started today. Pt states she has had a positive pregnancy test at home as well. Pt states she does not remember LMP. Pt c/o of abdominal cramping that started yesterday. Pt denies NVD.

## 2022-02-22 NOTE — ED Provider Notes (Signed)
[facility] Provider Note    Event Date/Time   First MD Initiated Contact with Patient 02/22/22 1817     (approximate)   History   Fever, Abdominal Pain, and Possible Pregnancy   HPI  Cynthia Waters is a 26 y.o. female who on review of note from December 13, 2021 G1 P1-0-0-1 (now G2 given noted pregnancy from today)  She reports no chronic medical conditions or illness.  She is healthy.  She reports last menstrual period is not quite clear but the last time she saw any vaginal bleeding was in the middle of March.  She is not having any pelvic pain or bleeding.  Yesterday started develop some chills and body aches.  Today started experiencing sore throat, body aches, fever chills fatigue and headache.  Also reports a slight amount of crampy abdominal pain that comes and goes.  No persistent pain.  No severe pain.  Patient reports she also had about a week ago somewhat similar symptoms and had a little bit of nausea and loose stools for a few days.  She is not taking medication today  As she left work because she was feeling ill.  Presently reports feeling, achy, headache, and sore throat.  She found out she was pregnant today when she did a home pregnancy test  Denies any known exposures to illnesses.     Physical Exam   Triage Vital Signs: ED Triage Vitals [02/22/22 1559]  Enc Vitals Group     BP 138/78     Pulse Rate (!) 123     Resp 18     Temp (!) 100.5 F (38.1 C)     Temp Source Oral     SpO2 100 %     Weight 145 lb (65.8 kg)     Height 5\' 3"  (1.6 m)     Head Circumference      Peak Flow      Pain Score 8     Pain Loc      Pain Edu?      Excl. in GC?     Most recent vital signs: Vitals:   02/22/22 2100 02/22/22 2200  BP: 102/63 102/64  Pulse: (!) 120 (!) 115  Resp: 16 16  Temp:  98.8 F (37.1 C)  SpO2: 97% 98%     General: Awake, no distress.  Very pleasant.  Does not appear acutely ill or toxic at this time CV:  Good  peripheral perfusion.  Normal heart tones except slight tachycardia Resp:  Normal effort.  Clear bilaterally.  No distress.  Speaks full clear sentences normal oxygen saturation Abd:  No distention.  Not obviously gravid.  Reports having some intermittent crampy abdominal pain, but presently no pain to palpation in the abdomen in any quadrant.  Negative Murphy.  No pain McBurney's point.  No rebound or guarding Other:  Offered to perform vaginal exam to obtain wet prep and further evaluate due to fever and new pregnancy, but patient declined would prefer self swab technique. Normal level of alertness.  No photophobia.  No rashes.  Denies use of tampons.  No meningismus.  Good range of motion of neck without stiffness or rigidity.   ED Results / Procedures / Treatments   Labs (all labs ordered are listed, but only abnormal results are displayed) Labs Reviewed  RESPIRATORY PANEL BY PCR - Abnormal; Notable for the following components:      Result Value   Rhinovirus / Enterovirus DETECTED (*)  All other components within normal limits  HCG, QUANTITATIVE, PREGNANCY - Abnormal; Notable for the following components:   hCG, Beta Chain, Quant, S 2,865 (*)    All other components within normal limits  CBC WITH DIFFERENTIAL/PLATELET - Abnormal; Notable for the following components:   WBC 12.2 (*)    Hemoglobin 11.3 (*)    HCT 35.7 (*)    Neutro Abs 10.4 (*)    All other components within normal limits  COMPREHENSIVE METABOLIC PANEL - Abnormal; Notable for the following components:   Glucose, Bld 112 (*)    All other components within normal limits  URINALYSIS, ROUTINE W REFLEX MICROSCOPIC - Abnormal; Notable for the following components:   Color, Urine COLORLESS (*)    APPearance CLEAR (*)    Specific Gravity, Urine 1.001 (*)    Leukocytes,Ua TRACE (*)    Bacteria, UA RARE (*)    All other components within normal limits  POC URINE PREG, ED - Abnormal; Notable for the following components:    Preg Test, Ur POSITIVE (*)    All other components within normal limits  RESP PANEL BY RT-PCR (FLU A&B, COVID) ARPGX2  GROUP A STREP BY PCR  WET PREP, GENITAL  CHLAMYDIA/NGC RT PCR ([facility] ONLY)            CULTURE, BLOOD (ROUTINE X 2)  CULTURE, BLOOD (ROUTINE X 2)  URINE CULTURE  LACTIC ACID, PLASMA  LIPASE, BLOOD  LACTIC ACID, PLASMA  MONONUCLEOSIS SCREEN     EKG     RADIOLOGY  US OB LESS THAN 14 WEEKS WITH OB TRANSVAGINAL  Result Date: 02/22/2022 CLINICAL DATA:  26 year old female with cramping for 1 week. Just had a baby 09/2021. Unknown last menstrual period. Positive quantitative beta HCG of 2,865. EXAM: OBSTETRIC <14 WK [place] AND TRANSVAGINAL OB TECHNIQUE: Both transabdominal and transvaginal ultrasound examinations were performed for complete evaluation of the gestation as well as the maternal uterus, adnexal regions, and pelvic cul-de-sac. Transvaginal technique was performed to assess early pregnancy. COMPARISON:  None Available. FINDINGS: Intrauterine gestational sac: A small anechoic region is seen within the endometrial canal within the uterine fundus with hyperechoic surrounding likely decidual reaction suggesting an intrauterine gestational sac. Yolk sac:  A small yolk sac is likely present. Embryo:  Not visualized Cardiac Activity: Not applicable Heart Rate: Not applicable MSD: 4 mm   5 w   1 d Subchorionic hemorrhage: A small subchorionic hematoma is seen measuring 1.5 x 0.5 x 2.0 cm. Maternal uterus/adnexae: The maternal uterus measures 10.0 x 5.1 x 7.8 cm. Bilateral maternal ovaries are within normal limits. Color-flow doppler evaluation of both ovaries demonstrates normal appearing low-resistance arterial and venous waveforms. Mild free fluid within the pelvis. IMPRESSION: 1. There is anechoic region that may represent a small intrauterine gestational sac versus pseudogestational sac. A small likely yolk sac is present. Differential considerations include early pregnancy and  failed pregnancy, and ectopic pregnancy cannot be excluded. Recommend continued serial exams beta HCG and ultrasound follow-up. 2. Small subchorionic hematoma measuring up to 2.0 cm. Electronically Signed   By: Neita Garnet M.D.   On: 02/22/2022 19:11      Reviewed patient's imaging, notable for findings likely representative early intrauterine pregnancy, albeit with her current hCG level and ultrasound findings we cannot definitively exclude ectopic.  Additionally a small subchorionic hematoma is noted   PROCEDURES:  Critical Care performed: No  Procedures   MEDICATIONS ORDERED IN ED: Medications  sodium chloride 0.9 % bolus 1,000 mL (1,000 mLs  Intravenous Not Given 02/22/22 2210)  acetaminophen (TYLENOL) tablet 1,000 mg (1,000 mg Oral Given 02/22/22 1956)  sodium chloride 0.9 % bolus 1,000 mL (0 mLs Intravenous Stopped 02/22/22 2113)  cephALEXin (KEFLEX) capsule 500 mg (500 mg Oral Given 02/22/22 2202)     IMPRESSION / MDM / ASSESSMENT AND PLAN / ED COURSE  I reviewed the triage vital signs and the nursing notes.                              Differential diagnosis includes, but is not limited to,   Patient's presentation is most consistent with acute illness / injury with system symptoms.  The patient is on the cardiac monitor to evaluate for evidence of arrhythmia and/or significant heart rate changes.  Labs interpreted by me notable for leukocytosis.  hCG positive at 2800    Clinical Course as of 02/22/22 2224  Fri Feb 22, 2022  1913 LMP unclear, doesn't recall have a period except in March perhaps.  [MQ]  2119 Patient reports she feels much better.  Her headache is improved.  She is resting comfortably at this time.  Heart rate remains elevated, lactic acid normal.  She salines no obvious evidence of poor perfusion or obvious septicemia.  At this point I suspect she may have a possible urinary tract infection with leukocytes and bacteria on a clean sample, and discussed  with her we will culture and treat with cephalexin.  In addition much of her symptoms including myalgias sore throat seem suggestive of possible viral illness.  Full viral panel is pending, thus far flu and COVID-negative. [MQ]  2120 She appears well, and goal of care at this time is to give additional fluid and likely discharged home on cephalexin with close outpatient follow-up. [MQ]  2120 She is in agreement.    Discussed with her.  Careful return precautions and she is in agreement.  Understands also early suspected first trimester pregnancy, but does need further follow-up including reevaluation and repeat ultrasound with OB/GYN for whom I have placed urgent referral [MQ]    Clinical Course User Index [MQ] Sharyn Creamer, MD   A+ blood type  ----------------------------------------- 9:22 PM on 02/22/2022 ----------------------------------------- Patient reexamined.  No distress well-appearing, repeat abdominal exam abdomen soft nontender nondistended in all quadrants.  Patient denies ongoing abdominal pain or symptoms.  No evidence of acute abdomen.  No pain McBurney's point.  Negative Murphy  ----------------------------------------- 10:22 PM on 02/22/2022 ----------------------------------------- Rhino/enterovirus positive on testing.  Suspect likely viral illness with rhinovirus, but also given patient is pregnant and noted urinalysis with bacteria present we will also treat for possible UTI.  Patient at this point request to be discharged feels much improved, she appears well and is fully alert improved remains slightly tachycardic but patient does not wish to stay for additional IV fluids at this point she appears well fully oriented without distress and I do not believe that she has high risk for progression to sepsis or severe sepsis as she is likely suffering viral illness at this time.  Careful return precautions reviewed with patient, will also treat with antibiotic for potential UTI.   Urgent referral placed with OB/GYN, and patient also encouraged to follow-up with her primary care as well as establish OB care with [facility].  Patient agreeable  Return precautions and treatment recommendations and follow-up discussed with the patient who is agreeable with the plan.   FINAL CLINICAL IMPRESSION(S) / ED DIAGNOSES  Final diagnoses:  Rhinovirus infection  Less than [redacted] weeks gestation of pregnancy  Bacteria in urine     Rx / DC Orders   ED Discharge Orders          Ordered    cephALEXin (KEFLEX) 250 MG capsule  4 times daily        02/22/22 2116    Ambulatory referral to Obstetrics / Gynecology       Comments: Suspect early pregnancy. ED follow-up, this coming week please   02/22/22 2121             Note:  This document was prepared using Dragon voice recognition software and may include unintentional dictation errors.   Sharyn Creamer, MD 02/22/22 2224

## 2022-02-22 NOTE — ED Notes (Signed)
Pt transported to Ultrasound.  

## 2022-02-22 NOTE — ED Triage Notes (Signed)
Patient c/o fever that started today and abdominal cramping since yesterday. Also c/o headache. Reports two positive pregnancy tests at home.

## 2022-02-23 NOTE — ED Notes (Signed)
Patient discharged at 2230 on 02/22/2022

## 2022-02-24 LAB — URINE CULTURE: Culture: >=100000 — AB

## 2022-02-27 ENCOUNTER — Ambulatory Visit (INDEPENDENT_AMBULATORY_CARE_PROVIDER_SITE_OTHER): Payer: BC Managed Care – PPO | Admitting: Obstetrics and Gynecology

## 2022-02-27 ENCOUNTER — Encounter: Payer: Self-pay | Admitting: Obstetrics and Gynecology

## 2022-02-27 VITALS — BP 113/70 | HR 69 | Ht 63.0 in | Wt 150.8 lb

## 2022-02-27 DIAGNOSIS — Z3481 Encounter for supervision of other normal pregnancy, first trimester: Secondary | ICD-10-CM | POA: Diagnosis not present

## 2022-02-27 DIAGNOSIS — Z3A01 Less than 8 weeks gestation of pregnancy: Secondary | ICD-10-CM | POA: Diagnosis not present

## 2022-02-27 DIAGNOSIS — O3680X0 Pregnancy with inconclusive fetal viability, not applicable or unspecified: Secondary | ICD-10-CM | POA: Diagnosis not present

## 2022-02-27 DIAGNOSIS — Z7689 Persons encountering health services in other specified circumstances: Secondary | ICD-10-CM

## 2022-02-27 LAB — CULTURE, BLOOD (ROUTINE X 2): Culture: NO GROWTH

## 2022-02-27 NOTE — Progress Notes (Signed)
HPI:      Ms. Cynthia Waters is a 26 y.o. G2P1001 who LMP was No LMP recorded (lmp unknown). Patient is pregnant.  Subjective:   She presents today for pregnancy confirmation.  She went to the emergency department because she was not feeling well and had a positive pregnancy test with an ultrasound showing a sac but no fetal pole.  Sac diameter was consistent with approximately 4-1/2 5-week pregnancy.  Subchorionic hemorrhage was noted She is not currently having any bleeding.  She has no complications at this time. Last pregnancy ended in a successful vaginal birth without issue.  She recently had a child in January. She is taking prenatal vitamins. She plans to breast-feed    Hx: The following portions of the patient's history were reviewed and updated as appropriate:             She  has no past medical history on file. She does not have a problem list on file. She  has a past surgical history that includes Wisdom tooth extraction and Open reduction internal fixation (orif) metacarpal (Left, 06/14/2019). Her family history is not on file. She  reports that she has never smoked. She has never used smokeless tobacco. She reports current alcohol use. She reports that she does not use drugs. She has a current medication list which includes the following prescription(s): cephalexin and multivitamin-prenatal. She has No Known Allergies.       Review of Systems:  Review of Systems  Constitutional: Denied constitutional symptoms, night sweats, recent illness, fatigue, fever, insomnia and weight loss.  Eyes: Denied eye symptoms, eye pain, photophobia, vision change and visual disturbance.  Ears/Nose/Throat/Neck: Denied ear, nose, throat or neck symptoms, hearing loss, nasal discharge, sinus congestion and sore throat.  Cardiovascular: Denied cardiovascular symptoms, arrhythmia, chest pain/pressure, edema, exercise intolerance, orthopnea and palpitations.  Respiratory: Denied pulmonary symptoms,  asthma, pleuritic pain, productive sputum, cough, dyspnea and wheezing.  Gastrointestinal: Denied, gastro-esophageal reflux, melena, nausea and vomiting.  Genitourinary: Denied genitourinary symptoms including symptomatic vaginal discharge, pelvic relaxation issues, and urinary complaints.  Musculoskeletal: Denied musculoskeletal symptoms, stiffness, swelling, muscle weakness and myalgia.  Dermatologic: Denied dermatology symptoms, rash and scar.  Neurologic: Denied neurology symptoms, dizziness, headache, neck pain and syncope.  Psychiatric: Denied psychiatric symptoms, anxiety and depression.  Endocrine: Denied endocrine symptoms including hot flashes and night sweats.   Meds:   Current Outpatient Medications on File Prior to Visit  Medication Sig Dispense Refill   cephALEXin (KEFLEX) 250 MG capsule Take 1 capsule (250 mg total) by mouth 4 (four) times daily for 5 days. 20 capsule 0   Prenatal Vit-Fe Fumarate-FA (MULTIVITAMIN-PRENATAL) 27-0.8 MG TABS tablet Take 1 tablet by mouth daily at 12 noon.     No current facility-administered medications on file prior to visit.      Objective:     Vitals:   02/27/22 1038  BP: 113/70  Pulse: 69   Filed Weights   02/27/22 1038  Weight: 150 lb 12.8 oz (68.4 kg)                        Assessment:    G2P1001 There are no problems to display for this patient.    1. Encounter to establish care   2. Encounter for supervision of other normal pregnancy, first trimester   3. [redacted] weeks gestation of pregnancy        Plan:  Prenatal Plan 1.  The patient was given prenatal literature. 2.  She was continued on prenatal vitamins. 3.  A prenatal lab panel to be drawn at nurse visit. 4.  An ultrasound was ordered to better determine an EDC and to see a fetal pole with heartbeat. 5.  A nurse visit was scheduled. 6.  Genetic testing and testing for other inheritable conditions discussed in detail. She will decide in the future  whether to have these labs performed. 7.  A general overview of pregnancy testing, visit schedule, ultrasound schedule, and prenatal care was discussed. 8.  Benefits of breast-feeding discussed in detail including both maternal and infant benefits. Ready Set Baby website discussed.  Orders No orders of the defined types were placed in this encounter.   No orders of the defined types were placed in this encounter.     F/U  No follow-ups on file. I spent 26 minutes involved in the care of this patient preparing to see the patient by obtaining and reviewing her medical history (including labs, imaging tests and prior procedures), documenting clinical information in the electronic health record (EHR), counseling and coordinating care plans, writing and sending prescriptions, ordering tests or procedures and in direct communicating with the patient and medical staff discussing pertinent items from her history and physical exam.  Elonda Husky, M.D. 02/27/2022 11:14 AM

## 2022-02-27 NOTE — Progress Notes (Signed)
Patient presents today to establish prenatal care after recent confirmation at hospital. Patient states going to ED due to not feeling well, pregnancy test preformed and resulted in positive. She recently had a baby in January and has not had a menstrual period, slight bleeding in March per ED. Ultrasound preformed in hospital showed patient at [redacted]w[redacted]d on 5/26 also showed a subchorionic hemorrhage. No other questions or concerns.

## 2022-03-13 ENCOUNTER — Ambulatory Visit (INDEPENDENT_AMBULATORY_CARE_PROVIDER_SITE_OTHER): Payer: BC Managed Care – PPO

## 2022-03-13 DIAGNOSIS — Z3A01 Less than 8 weeks gestation of pregnancy: Secondary | ICD-10-CM | POA: Diagnosis not present

## 2022-03-13 DIAGNOSIS — Z3481 Encounter for supervision of other normal pregnancy, first trimester: Secondary | ICD-10-CM

## 2022-03-18 ENCOUNTER — Telehealth: Payer: Self-pay | Admitting: Obstetrics and Gynecology

## 2022-03-18 ENCOUNTER — Encounter: Payer: Self-pay | Admitting: Obstetrics and Gynecology

## 2022-03-18 ENCOUNTER — Other Ambulatory Visit: Payer: Self-pay | Admitting: Obstetrics and Gynecology

## 2022-03-18 DIAGNOSIS — O468X1 Other antepartum hemorrhage, first trimester: Secondary | ICD-10-CM

## 2022-03-18 DIAGNOSIS — O219 Vomiting of pregnancy, unspecified: Secondary | ICD-10-CM

## 2022-03-18 MED ORDER — PROMETHAZINE HCL 25 MG PO TABS: 25.0000 mg | ORAL_TABLET | Freq: Four times a day (QID) | ORAL | 2 refills | Status: DC | PRN

## 2022-03-18 NOTE — Telephone Encounter (Signed)
Spoke with patient. Patient states morning sickness for the past 2 weeks. Advised patient to try the following: saltine crackers before getting out of bed, eating small bland meals, do not allow and empty or overly full stomach, avoid greasy, acidic or spicy foods, try ginger 250 mg 4xday or ginger tea and to try B6 3xday along with Unisom. She states she has tried many of the listed tips with no relief. Patient states she has been able to tolerate liquids for the last 12 hours. No current signs of dehydration are present. Medication options were discussed and at this time patient has requested Phenergan to take as needed. Confirmed pharmacy on file, medication sent per office protocol. All questions answered.

## 2022-03-18 NOTE — Addendum Note (Signed)
Addended by: Georgiana Shore R on: 03/18/2022 09:30 AM   Modules accepted: Orders

## 2022-03-18 NOTE — Telephone Encounter (Signed)
Patient called and states she is having really bad morning sickness. Requesting a RX be sent in to pharmacy for nausea. Confirmed pharmacy as [facility] on [address], not the one inside of [address]. Please advise.

## 2022-03-18 NOTE — Telephone Encounter (Signed)
Attempted to reach patient, LVM for her to call back.

## 2022-03-19 ENCOUNTER — Ambulatory Visit (INDEPENDENT_AMBULATORY_CARE_PROVIDER_SITE_OTHER): Payer: Medicaid Other

## 2022-03-19 DIAGNOSIS — Z3A00 Weeks of gestation of pregnancy not specified: Secondary | ICD-10-CM

## 2022-03-19 DIAGNOSIS — Z3480 Encounter for supervision of other normal pregnancy, unspecified trimester: Secondary | ICD-10-CM | POA: Insufficient documentation

## 2022-03-19 DIAGNOSIS — Z3481 Encounter for supervision of other normal pregnancy, first trimester: Secondary | ICD-10-CM

## 2022-03-19 NOTE — Progress Notes (Signed)
New OB Intake  I connected with  Cynthia Waters on 03/19/22 at  8:15 AM EDT by telephone Video Visit and verified that I am speaking with the correct person using two identifiers. Nurse is located at [facility] and pt is located on her way to work.  I explained I am completing New OB Intake today. We discussed her EDD of 02/27/2022 that is based on LMP of unknown; u/s done. Pt is G2/P1001. I reviewed her allergies, medications, Medical/Surgical/OB history, and appropriate screenings. Based on history, this is a/an pregnancy uncomplicated .   There are no problems to display for this patient.   Concerns addressed today None.  Delivery Plans:  Plans to deliver at [facility]  Anatomy US Explained she will be call to schedule u/s and NOB labs before her appt for physical.  Labs Patient does no want genetic testing done at Lab visit. Discussed possible labs to be drawn at new OB appointment.  COVID Vaccine Patient has not had COVID vaccine.   Social Determinants of Health Food Insecurity: denies food insecurity Transportation: Patient denies transportation needs.  First visit review I reviewed new OB appt with pt. I explained she will have ob bloodwork and pap smear/pelvic exam if indicated. Explained pt will be seen by Dr. Brennan Bailey at first visit; encounter routed to appropriate provider.   Loran Senters, [facility] 03/19/2022  8:52 AM  Clinical Staff Provider  Office Location  [facility] Dating    Language  English Anatomy US    Flu Vaccine  offer Genetic Screen  NIPS:   TDaP vaccine   offer Hgb A1C or  GTT Early : Third trimester :   Covid declines   LAB RESULTS   Rhogam   Blood Type     Feeding Plan breast Antibody    Contraception pill Rubella    Circumcision yes RPR     Pediatrician  [facility]. Peds HBsAg     Support Person Mason HIV    Prenatal Classes yes Varicella   Breast feeding classes yes GBS  (For PCN allergy, check sensitivities)   BTL  Consent  Hep C   VBAC Consent  Pap      Hgb Electro      CF      SMA

## 2022-03-25 ENCOUNTER — Ambulatory Visit (INDEPENDENT_AMBULATORY_CARE_PROVIDER_SITE_OTHER): Payer: BC Managed Care – PPO

## 2022-03-25 DIAGNOSIS — O418X10 Other specified disorders of amniotic fluid and membranes, first trimester, not applicable or unspecified: Secondary | ICD-10-CM | POA: Diagnosis not present

## 2022-03-25 DIAGNOSIS — O468X1 Other antepartum hemorrhage, first trimester: Secondary | ICD-10-CM

## 2022-03-27 ENCOUNTER — Other Ambulatory Visit: Payer: Self-pay | Admitting: Obstetrics and Gynecology

## 2022-03-27 ENCOUNTER — Other Ambulatory Visit: Payer: Self-pay

## 2022-03-27 DIAGNOSIS — Z3481 Encounter for supervision of other normal pregnancy, first trimester: Secondary | ICD-10-CM

## 2022-03-27 DIAGNOSIS — O418X10 Other specified disorders of amniotic fluid and membranes, first trimester, not applicable or unspecified: Secondary | ICD-10-CM

## 2022-04-03 ENCOUNTER — Ambulatory Visit: Admission: RE | Admit: 2022-04-03 | Payer: Medicaid Other | Source: Ambulatory Visit

## 2022-04-10 ENCOUNTER — Other Ambulatory Visit: Payer: BC Managed Care – PPO

## 2022-04-10 ENCOUNTER — Telehealth: Payer: Self-pay | Admitting: Obstetrics and Gynecology

## 2022-04-10 NOTE — Telephone Encounter (Signed)
S/w pt- made her aware of [place] apt- she states she is unable to make that date- I provided centralized scheduling number to reschedule as they can coordinate with her schedule better.

## 2022-04-24 ENCOUNTER — Encounter: Payer: BLUE CROSS/BLUE SHIELD | Admitting: Obstetrics and Gynecology

## 2022-04-24 ENCOUNTER — Encounter: Payer: Self-pay | Admitting: Obstetrics and Gynecology

## 2022-04-24 NOTE — Telephone Encounter (Signed)
Sending note for documentation as I know some one spoke to this patient this morning,  Thanks!

## 2022-04-26 ENCOUNTER — Ambulatory Visit: Admission: RE | Admit: 2022-04-26 | Payer: Medicaid Other | Source: Ambulatory Visit

## 2022-05-07 ENCOUNTER — Other Ambulatory Visit (HOSPITAL_COMMUNITY)
Admission: RE | Admit: 2022-05-07 | Discharge: 2022-05-07 | Disposition: A | Discharge status: Home / Self Care | Payer: BC Managed Care – PPO | Source: Ambulatory Visit | Attending: Obstetrics and Gynecology | Admitting: Obstetrics and Gynecology

## 2022-05-07 ENCOUNTER — Ambulatory Visit (INDEPENDENT_AMBULATORY_CARE_PROVIDER_SITE_OTHER): Payer: BC Managed Care – PPO | Admitting: Obstetrics and Gynecology

## 2022-05-07 ENCOUNTER — Ambulatory Visit (INDEPENDENT_AMBULATORY_CARE_PROVIDER_SITE_OTHER): Payer: BC Managed Care – PPO

## 2022-05-07 ENCOUNTER — Encounter: Payer: Self-pay | Admitting: Obstetrics and Gynecology

## 2022-05-07 DIAGNOSIS — Z124 Encounter for screening for malignant neoplasm of cervix: Secondary | ICD-10-CM | POA: Diagnosis not present

## 2022-05-07 DIAGNOSIS — Z3A15 15 weeks gestation of pregnancy: Secondary | ICD-10-CM | POA: Diagnosis not present

## 2022-05-07 DIAGNOSIS — O418X10 Other specified disorders of amniotic fluid and membranes, first trimester, not applicable or unspecified: Secondary | ICD-10-CM

## 2022-05-07 DIAGNOSIS — O468X1 Other antepartum hemorrhage, first trimester: Secondary | ICD-10-CM

## 2022-05-07 DIAGNOSIS — Z3481 Encounter for supervision of other normal pregnancy, first trimester: Secondary | ICD-10-CM

## 2022-05-07 LAB — POCT URINALYSIS DIPSTICK OB
Bilirubin, UA: NEGATIVE
Blood, UA: NEGATIVE
Glucose, UA: NEGATIVE
Ketones, UA: NEGATIVE
Leukocytes, UA: NEGATIVE
Nitrite, UA: NEGATIVE
POC,PROTEIN,UA: NEGATIVE
Spec Grav, UA: <=1.005 — AB (ref 1.010–1.025)
Urobilinogen, UA: 0.2 E.U./dL
pH, UA: 7 (ref 5.0–8.0)

## 2022-05-07 NOTE — Progress Notes (Signed)
Patient presents today for New OB physical. She states doing well, still gets sick in the morning but is doing better. Patient is due for her pap smear. Patient declines genetic testing, Materniti21 not ordered.  Patient states no other questions or concerns at this time.

## 2022-05-07 NOTE — Progress Notes (Signed)
NOB: Pap performed today- bleeding has resolved -follow-up ultrasound today shows no evidence of subchorionic hemorrhage.  Declined genetic testing.  We will consider genetic testing and AFP at next visit.  Scheduled fetal anatomy at 22 weeks.  Physical examination General NAD, Conversant  HEENT Atraumatic; Op clear with mmm.  Normo-cephalic. Pupils reactive. Anicteric sclerae  Thyroid/Neck Smooth without nodularity or enlargement. Normal ROM.  Neck Supple.  Skin No rashes, lesions or ulceration. Normal palpated skin turgor. No nodularity.  Breasts: No masses or discharge.  Symmetric.  No axillary adenopathy.  Lungs: Clear to auscultation.No rales or wheezes. Normal Respiratory effort, no retractions.  Heart: NSR.  No murmurs or rubs appreciated. No periferal edema  Abdomen: Soft.  Non-tender.  No masses.  No HSM.  Abdominal weakness above umbilicus-possible diastasis versus ventral hernia  Extremities: Moves all appropriately.  Normal ROM for age. No lymphadenopathy.  Neuro: Oriented to PPT.  Normal mood. Normal affect.     Pelvic:   Vulva: Normal appearance.  No lesions.  Vagina: No lesions or abnormalities noted.  Support: Normal pelvic support.  Urethra No masses tenderness or scarring.  Meatus Normal size without lesions or prolapse.  Cervix: Normal appearance.  No lesions.  Anus: Normal exam.  No lesions.  Perineum: Normal exam.  No lesions.        Bimanual   Adnexae: No masses.  Non-tender to palpation.  Uterus: Enlarged.   Non-tender.  Mobile.  AV.  Adnexae: No masses.  Non-tender to palpation.  Cul-de-sac: Negative for abnormality.  Adnexae: No masses.  Non-tender to palpation.         Pelvimetry   Diagonal: Reached.  Spines: Average.  Sacrum: Concave.  Pubic Arch: Normal.

## 2022-05-09 LAB — CYTOLOGY - PAP
Chlamydia: NEGATIVE
Comment: NEGATIVE
Comment: NORMAL
Diagnosis: NEGATIVE
Neisseria Gonorrhea: NEGATIVE

## 2022-06-04 ENCOUNTER — Ambulatory Visit (INDEPENDENT_AMBULATORY_CARE_PROVIDER_SITE_OTHER): Payer: Medicaid Other | Admitting: Obstetrics and Gynecology

## 2022-06-04 VITALS — BP 129/76 | HR 88 | Wt 145.3 lb

## 2022-06-04 DIAGNOSIS — Z113 Encounter for screening for infections with a predominantly sexual mode of transmission: Secondary | ICD-10-CM

## 2022-06-04 DIAGNOSIS — Z3482 Encounter for supervision of other normal pregnancy, second trimester: Secondary | ICD-10-CM

## 2022-06-04 DIAGNOSIS — Z1379 Encounter for other screening for genetic and chromosomal anomalies: Secondary | ICD-10-CM

## 2022-06-04 DIAGNOSIS — Z0283 Encounter for blood-alcohol and blood-drug test: Secondary | ICD-10-CM

## 2022-06-04 DIAGNOSIS — Z3A19 19 weeks gestation of pregnancy: Secondary | ICD-10-CM

## 2022-06-04 NOTE — Progress Notes (Signed)
ROB: Patient doing well, no issues. Still notes not feeling movement just yet. Given reassurance as FHT good today.  Discussed short interval pregnancy. Plans to breastfeed.  Declines genetic screening. Has not had prenatal labs drawn to date, will perform today. Scheduled for anatomy scan in 2 weeks. RTC in 4 weeks.

## 2022-06-05 LAB — VIRAL HEPATITIS HBV, HCV
HCV Ab: NONREACTIVE
Hep B Core Total Ab: NEGATIVE
Hep B Surface Ab, Qual: NONREACTIVE
Hepatitis B Surface Ag: NEGATIVE

## 2022-06-05 LAB — VARICELLA ZOSTER ANTIBODY, IGG: Varicella zoster IgG: 842 index (ref >165)

## 2022-06-05 LAB — GC/CHLAMYDIA PROBE AMP
Chlamydia trachomatis, NAA: NEGATIVE
Neisseria Gonorrhoeae by PCR: NEGATIVE

## 2022-06-05 LAB — HIV ANTIBODY (ROUTINE TESTING W REFLEX): HIV Screen 4th Generation wRfx: NONREACTIVE

## 2022-06-05 LAB — URINALYSIS, ROUTINE W REFLEX MICROSCOPIC
Bilirubin, UA: NEGATIVE
Glucose, UA: NEGATIVE
Ketones, UA: NEGATIVE
Leukocytes,UA: NEGATIVE
Nitrite, UA: NEGATIVE
RBC, UA: NEGATIVE
Specific Gravity, UA: 1.023 (ref 1.005–1.030)
Urobilinogen, Ur: 1 mg/dL (ref 0.2–1.0)
pH, UA: 7 (ref 5.0–7.5)

## 2022-06-05 LAB — TOXOPLASMA ANTIBODIES- IGG AND  IGM
Toxoplasma Antibody- IgM: <3 AU/mL (ref 0.0–7.9)
Toxoplasma IgG Ratio: <3 IU/mL (ref 0.0–7.1)

## 2022-06-05 LAB — ABO AND RH: Rh Factor: POSITIVE

## 2022-06-05 LAB — HCV INTERPRETATION

## 2022-06-05 LAB — RPR: RPR Ser Ql: NONREACTIVE

## 2022-06-05 LAB — HGB SOLU + RFLX FRAC: Sickle Solubility Test - HGBRFX: NEGATIVE

## 2022-06-05 LAB — RUBELLA SCREEN: Rubella Antibodies, IGG: 4.53 index (ref >0.99)

## 2022-06-05 LAB — ANTIBODY SCREEN: Antibody Screen: NEGATIVE

## 2022-06-06 LAB — MONITOR DRUG PROFILE 14(MW)
Amphetamine Scrn, Ur: NEGATIVE ng/mL
BARBITURATE SCREEN URINE: NEGATIVE ng/mL
BENZODIAZEPINE SCREEN, URINE: NEGATIVE ng/mL
Buprenorphine, Urine: NEGATIVE ng/mL
CANNABINOIDS UR QL SCN: NEGATIVE ng/mL
Cocaine (Metab) Scrn, Ur: NEGATIVE ng/mL
Creatinine(Crt), U: 176.3 mg/dL (ref 20.0–300.0)
Fentanyl, Urine: NEGATIVE pg/mL
Meperidine Screen, Urine: NEGATIVE ng/mL
Methadone Screen, Urine: NEGATIVE ng/mL
OXYCODONE+OXYMORPHONE UR QL SCN: NEGATIVE ng/mL
Opiate Scrn, Ur: NEGATIVE ng/mL
Ph of Urine: 7.1 (ref 4.5–8.9)
Phencyclidine Qn, Ur: NEGATIVE ng/mL
Propoxyphene Scrn, Ur: NEGATIVE ng/mL
SPECIFIC GRAVITY: 1.027
Tramadol Screen, Urine: NEGATIVE ng/mL

## 2022-06-06 LAB — NICOTINE SCREEN, URINE: Cotinine Ql Scrn, Ur: NEGATIVE ng/mL

## 2022-06-07 LAB — URINE CULTURE, OB REFLEX

## 2022-06-07 LAB — CULTURE, OB URINE

## 2022-06-09 LAB — MATERNIT21  PLUS CORE+ESS+SCA, BLOOD
11q23 deletion (Jacobsen): NOT DETECTED
15q11 deletion (PW Angelman): NOT DETECTED
1p36 deletion syndrome: NOT DETECTED
22q11 deletion (DiGeorge): NOT DETECTED
4p16 deletion(Wolf-Hirschhorn): NOT DETECTED
5p15 deletion (Cri-du-chat): NOT DETECTED
8q24 deletion (Langer-Giedion): NOT DETECTED
Fetal Fraction: 12
Monosomy X (Turner Syndrome): NOT DETECTED
Result (T21): NEGATIVE
Trisomy 13 (Patau syndrome): NEGATIVE
Trisomy 16: NOT DETECTED
Trisomy 18 (Edwards syndrome): NEGATIVE
Trisomy 21 (Down syndrome): NEGATIVE
Trisomy 22: NOT DETECTED
XXX (Triple X Syndrome): NOT DETECTED
XXY (Klinefelter Syndrome): NOT DETECTED
XYY (Jacobs Syndrome): NOT DETECTED

## 2022-06-18 ENCOUNTER — Other Ambulatory Visit: Payer: Medicaid Other

## 2022-06-24 ENCOUNTER — Ambulatory Visit (INDEPENDENT_AMBULATORY_CARE_PROVIDER_SITE_OTHER): Payer: BC Managed Care – PPO

## 2022-06-24 DIAGNOSIS — Z3A22 22 weeks gestation of pregnancy: Secondary | ICD-10-CM

## 2022-06-24 DIAGNOSIS — Z3A15 15 weeks gestation of pregnancy: Secondary | ICD-10-CM

## 2022-06-24 DIAGNOSIS — Z3492 Encounter for supervision of normal pregnancy, unspecified, second trimester: Secondary | ICD-10-CM | POA: Diagnosis not present

## 2022-06-30 ENCOUNTER — Ambulatory Visit
Admission: EM | Admit: 2022-06-30 | Discharge: 2022-06-30 | Disposition: A | Discharge status: Home / Self Care | Payer: BC Managed Care – PPO | Attending: Urgent Care | Admitting: Urgent Care

## 2022-06-30 DIAGNOSIS — R300 Dysuria: Secondary | ICD-10-CM | POA: Insufficient documentation

## 2022-06-30 DIAGNOSIS — N3001 Acute cystitis with hematuria: Secondary | ICD-10-CM | POA: Insufficient documentation

## 2022-06-30 LAB — POCT URINALYSIS DIP (MANUAL ENTRY)
Bilirubin, UA: NEGATIVE
Glucose, UA: NEGATIVE mg/dL
Ketones, POC UA: NEGATIVE mg/dL
Nitrite, UA: NEGATIVE
Protein Ur, POC: 30 mg/dL — AB
Spec Grav, UA: 1.02 (ref 1.010–1.025)
Urobilinogen, UA: 1 E.U./dL
pH, UA: 7 (ref 5.0–8.0)

## 2022-06-30 MED ORDER — NITROFURANTOIN MONOHYD MACRO 100 MG PO CAPS: 100.0000 mg | ORAL_CAPSULE | Freq: Two times a day (BID) | ORAL | 0 refills | Status: DC

## 2022-06-30 NOTE — ED Provider Notes (Signed)
[facility]    CSN: [id number] Arrival date & time: 06/30/22  0841      History   Chief Complaint Chief Complaint  Patient presents with   Dysuria    HPI Cynthia Waters is a 26 y.o. female.   HPI  Presents to urgent care with complaint of painful urination x2 days.  Denies fever.  Denies abdominal pain.  Denies back pain.  Patient is [redacted] weeks pregnant.  History reviewed. No pertinent past medical history.  Patient Active Problem List   Diagnosis Date Noted   Supervision of other normal pregnancy, antepartum 03/19/2022    Past Surgical History:  Procedure Laterality Date   OPEN REDUCTION INTERNAL FIXATION (ORIF) METACARPAL Left 06/14/2019   Procedure: OPEN REDUCTION INTERNAL FIXATION (ORIF) LEFT FIFTH METACARPAL;  Surgeon: Milly Jakob, MD;  Location: [place];  Service: Orthopedics;  Laterality: Left;  WITH PREOP BLOCK   WISDOM TOOTH EXTRACTION      OB History     Gravida  2   Para  1   Term  1   Preterm      AB      Living  1      SAB      IAB      Ectopic      Multiple      Live Births  1            Home Medications    Prior to Admission medications   Medication Sig Start Date End Date Taking? Authorizing Provider  Prenatal Vit-Fe Fumarate-FA (MULTIVITAMIN-PRENATAL) 27-0.8 MG TABS tablet Take 1 tablet by mouth daily at 12 noon.    [provider]  promethazine (PHENERGAN) 25 MG tablet Take 1 tablet (25 mg total) by mouth every 6 (six) hours as needed for nausea or vomiting. 03/18/22   Harlin Heys, MD    Family History History reviewed. No pertinent family history.  Social History Social History   Tobacco Use   Smoking status: Never   Smokeless tobacco: Never  Vaping Use   Vaping Use: Never used  Substance Use Topics   Alcohol use: Not Currently    Comment: occasional   Drug use: No     Allergies   Patient has no known allergies.   Review of Systems Review of  Systems   Physical Exam Triage Vital Signs ED Triage Vitals [06/30/22 0901]  Enc Vitals Group     BP 123/82     Pulse Rate 67     Resp 16     Temp 98.1 F (36.7 C)     Temp src      SpO2 98 %     Weight      Height      Head Circumference      Peak Flow      Pain Score 7     Pain Loc      Pain Edu?      Excl. in [place]?    No data found.  Updated Vital Signs BP 123/82   Pulse 67   Temp 98.1 F (36.7 C)   Resp 16   LMP  (LMP Unknown)   SpO2 98%   Breastfeeding No   Visual Acuity Right Eye Distance:   Left Eye Distance:   Bilateral Distance:    Right Eye Near:   Left Eye Near:    Bilateral Near:     Physical Exam   UC Treatments / Results  Labs (all labs ordered are listed, but only abnormal results are displayed) Labs Reviewed  POCT URINALYSIS DIP (MANUAL ENTRY)    EKG   Radiology No results found.  Procedures Procedures (including critical care time)  Medications Ordered in UC Medications - No data to display  Initial Impression / Assessment and Plan / UC Course  I have reviewed the triage vital signs and the nursing notes.  Pertinent labs & imaging results that were available during my care of the patient were reviewed by me and considered in my medical decision making (see chart for details).   UA positive with large leuks, blood.  Will give Macrobid for acute cystitis with hematuria.  Pyelonephritis is not suspected.  Macrobid is considered safe in the second trimester.  Urine culture will be sent to confirm susceptibility given potential limitation in prescribing antibiotics due to her pregnancy.   Final Clinical Impressions(s) / UC Diagnoses   Final diagnoses:  Dysuria     Discharge Instructions      Follow up here or with your primary care provider if your symptoms are worsening or not improving with treatment.    ED Prescriptions   None    PDMP not reviewed this encounter.   Charma Igo, [place] 06/30/22 [phone]

## 2022-06-30 NOTE — Discharge Instructions (Addendum)
Follow up here or with your primary care provider if your symptoms are worsening or not improving with treatment.     

## 2022-06-30 NOTE — ED Triage Notes (Signed)
Pt. States she started having dysuria Friday. Pt. States she is [redacted] weeks pregnant as well.

## 2022-07-02 ENCOUNTER — Encounter: Payer: Self-pay | Admitting: Obstetrics and Gynecology

## 2022-07-02 LAB — URINE CULTURE: Culture: 70000 — AB

## 2022-07-04 ENCOUNTER — Encounter: Payer: BC Managed Care – PPO | Admitting: Obstetrics and Gynecology

## 2022-07-04 DIAGNOSIS — Z3A24 24 weeks gestation of pregnancy: Secondary | ICD-10-CM

## 2022-07-04 DIAGNOSIS — Z3482 Encounter for supervision of other normal pregnancy, second trimester: Secondary | ICD-10-CM

## 2022-08-13 ENCOUNTER — Encounter: Payer: Self-pay | Admitting: Obstetrics and Gynecology

## 2022-08-13 ENCOUNTER — Ambulatory Visit (INDEPENDENT_AMBULATORY_CARE_PROVIDER_SITE_OTHER): Payer: BC Managed Care – PPO | Admitting: Obstetrics and Gynecology

## 2022-08-13 VITALS — BP 105/71 | HR 97 | Wt 161.5 lb

## 2022-08-13 DIAGNOSIS — Z3482 Encounter for supervision of other normal pregnancy, second trimester: Secondary | ICD-10-CM

## 2022-08-13 DIAGNOSIS — Z23 Encounter for immunization: Secondary | ICD-10-CM

## 2022-08-13 DIAGNOSIS — O0932 Supervision of pregnancy with insufficient antenatal care, second trimester: Secondary | ICD-10-CM

## 2022-08-13 DIAGNOSIS — Z131 Encounter for screening for diabetes mellitus: Secondary | ICD-10-CM

## 2022-08-13 DIAGNOSIS — Z2821 Immunization not carried out because of patient refusal: Secondary | ICD-10-CM

## 2022-08-13 DIAGNOSIS — Z113 Encounter for screening for infections with a predominantly sexual mode of transmission: Secondary | ICD-10-CM

## 2022-08-13 DIAGNOSIS — Z3A29 29 weeks gestation of pregnancy: Secondary | ICD-10-CM

## 2022-08-13 DIAGNOSIS — Z130 Encounter for screening for diseases of the blood and blood-forming organs and certain disorders involving the immune mechanism: Secondary | ICD-10-CM

## 2022-08-13 LAB — POCT URINALYSIS DIPSTICK OB
Glucose, UA: NEGATIVE
Leukocytes, UA: NEGATIVE
POC,PROTEIN,UA: NEGATIVE

## 2022-08-13 NOTE — Progress Notes (Unsigned)
ROB: Patient doing well, no issiues. Has missed several appointment due to work (has not been seen since September). For 28 week labs today.  Plans to breastfeed, desires  considering IUD  for contraception but unsure of which type. Also has questions about BTL. For Tdap today, signed blood consent.  Declines flu vaccine. Reports round ligament pain, discussed relieving measures. To f/u in 2 weeks.

## 2022-08-13 NOTE — Progress Notes (Unsigned)
ROB [redacted]w[redacted]d: She is doing well today. She had some bad pelvic pain over the weekend, but it is gradually resolving. Declined Flu and Tdap vaccine.

## 2022-08-14 ENCOUNTER — Encounter: Payer: Self-pay | Admitting: Obstetrics and Gynecology

## 2022-08-14 ENCOUNTER — Other Ambulatory Visit: Payer: Self-pay | Admitting: Obstetrics and Gynecology

## 2022-08-14 LAB — CBC
Hematocrit: 30.1 % — ABNORMAL LOW (ref 34.0–46.6)
Hemoglobin: 9.6 g/dL — ABNORMAL LOW (ref 11.1–15.9)
MCH: 27.1 pg (ref 26.6–33.0)
MCHC: 31.9 g/dL (ref 31.5–35.7)
MCV: 85 fL (ref 79–97)
Platelets: 287 10*3/uL (ref 150–450)
RBC: 3.54 x10E6/uL — ABNORMAL LOW (ref 3.77–5.28)
RDW: 12.5 % (ref 11.7–15.4)
WBC: 7.4 10*3/uL (ref 3.4–10.8)

## 2022-08-14 LAB — GLUCOSE, 1 HOUR GESTATIONAL: Gestational Diabetes Screen: 92 mg/dL (ref 70–139)

## 2022-08-14 LAB — RPR: RPR Ser Ql: NONREACTIVE

## 2022-08-14 MED ORDER — FUSION PLUS PO CAPS: 1.0000 | ORAL_CAPSULE | Freq: Every day | ORAL | 2 refills | Status: DC

## 2022-08-28 ENCOUNTER — Encounter: Payer: BC Managed Care – PPO | Admitting: Obstetrics and Gynecology

## 2022-08-28 ENCOUNTER — Telehealth: Payer: Self-pay | Admitting: Obstetrics and Gynecology

## 2022-08-28 DIAGNOSIS — Z3A31 31 weeks gestation of pregnancy: Secondary | ICD-10-CM

## 2022-08-28 DIAGNOSIS — Z3482 Encounter for supervision of other normal pregnancy, second trimester: Secondary | ICD-10-CM

## 2022-08-28 NOTE — Telephone Encounter (Signed)
Reached out to pt to reschedule ROB appt that was scheduled for 11/29 at 9:30 with Dr. Logan Bores.  Left message for pt to call back to reschedule.

## 2022-08-29 NOTE — Telephone Encounter (Signed)
Pt called in on 11/30 to reschedule appt.  She is scheduled on 12/13 with Dr. Logan Bores.

## 2022-08-29 NOTE — Telephone Encounter (Signed)
Patient is scheduled for 12/13 Dr. Logan Bores

## 2022-09-11 ENCOUNTER — Ambulatory Visit (INDEPENDENT_AMBULATORY_CARE_PROVIDER_SITE_OTHER): Payer: BC Managed Care – PPO | Admitting: Obstetrics and Gynecology

## 2022-09-11 ENCOUNTER — Telehealth: Payer: Self-pay

## 2022-09-11 ENCOUNTER — Encounter: Payer: Self-pay | Admitting: Obstetrics and Gynecology

## 2022-09-11 VITALS — BP 117/75 | HR 96 | Wt 161.8 lb

## 2022-09-11 DIAGNOSIS — Z3A33 33 weeks gestation of pregnancy: Secondary | ICD-10-CM

## 2022-09-11 DIAGNOSIS — Z3482 Encounter for supervision of other normal pregnancy, second trimester: Secondary | ICD-10-CM

## 2022-09-11 LAB — POCT URINALYSIS DIPSTICK OB
Bilirubin, UA: NEGATIVE
Blood, UA: NEGATIVE
Glucose, UA: NEGATIVE
Ketones, UA: NEGATIVE
Leukocytes, UA: NEGATIVE
Nitrite, UA: NEGATIVE
POC,PROTEIN,UA: NEGATIVE
Spec Grav, UA: 1.01 (ref 1.010–1.025)
Urobilinogen, UA: 0.2 E.U./dL
pH, UA: 6.5 (ref 5.0–8.0)

## 2022-09-11 NOTE — Telephone Encounter (Signed)
Porsche called triage line stating she needs a letter for proof of pregnancy for her insurance   Letter created and sent thru [employer]

## 2022-09-11 NOTE — Progress Notes (Addendum)
Cynthia Waters. Patient states fetal movement with pelvic pressure. States she lost her mucous plug last week. She has started to take an iron supplement. Questions regarding pre-registration for L&D, website information given. Patient states no questions or concerns at this time.

## 2022-09-11 NOTE — Progress Notes (Signed)
ROB: Denies problems.  Has occasional episodes of pelvic pressure but denies contractions.  Taking iron and prenatal vitamins.

## 2022-09-20 IMAGING — US US OB < 14 WEEKS - US OB TV
1 series · 15 of 28 positions shown · non-contrast
Comparison: None Available.

CLINICAL DATA: 25-year-old female with cramping for 1 week. Just
had a baby [DATE]. Unknown last menstrual period. Positive
quantitative beta HCG of [DATE].

EXAM:
OBSTETRIC <14 WK US AND TRANSVAGINAL OB
TECHNIQUE: Both transabdominal and transvaginal ultrasound examinations were
performed for complete evaluation of the gestation as well as the
maternal uterus, adnexal regions, and pelvic cul-de-sac.
Transvaginal technique was performed to assess early pregnancy.

[Series 1: us ob comp less 14 wks · 15 of 55 slices shown]
[im 1/55]
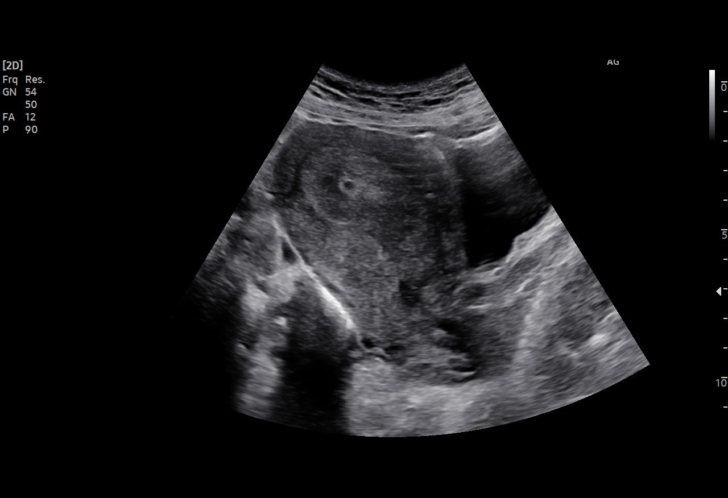
[im 5/55]
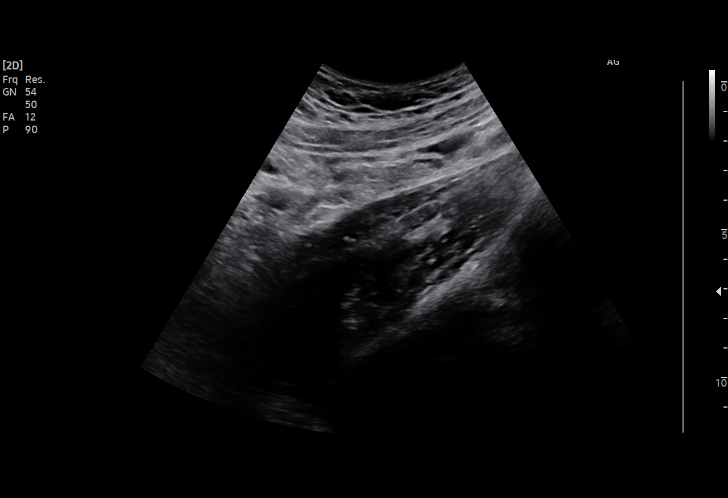
[im 9/55]
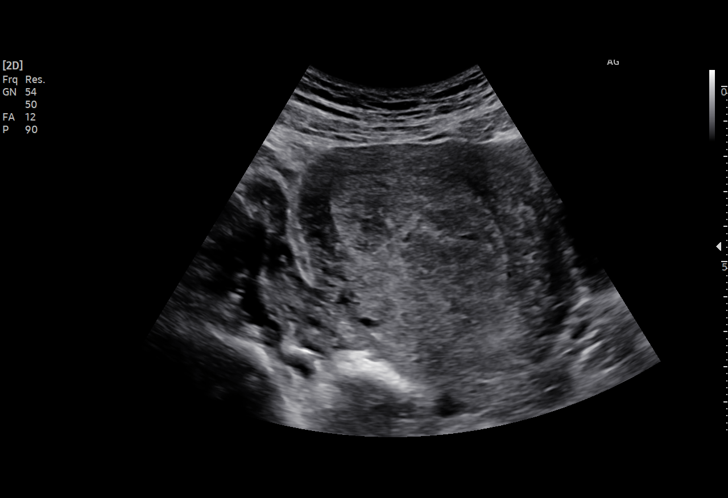
[im 13/55]
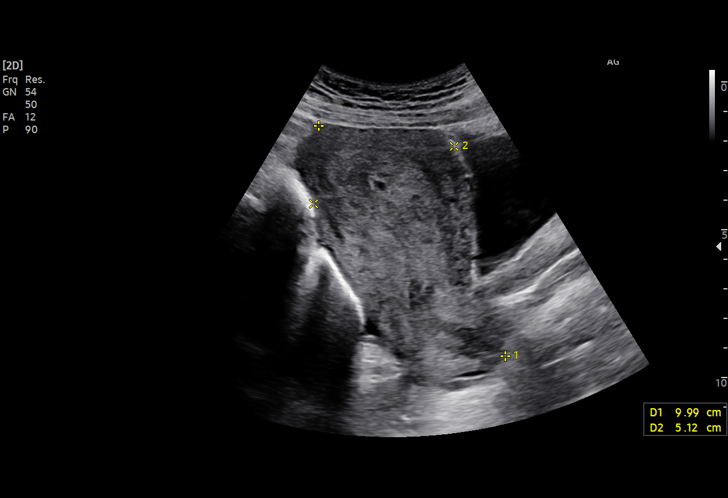
[im 17/55]
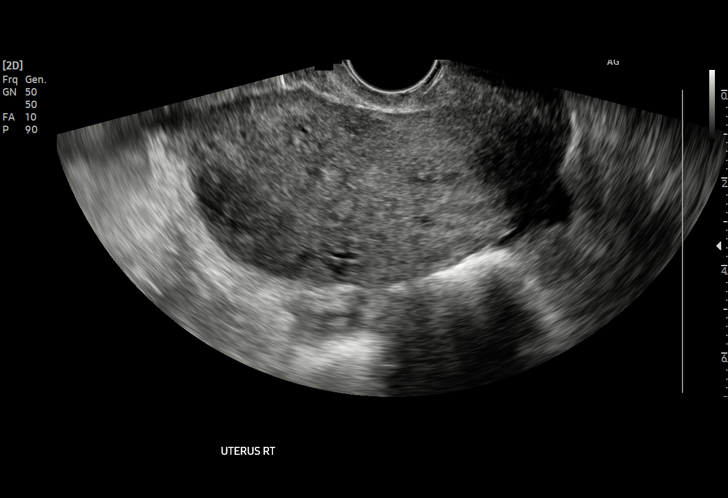
[im 21/55]
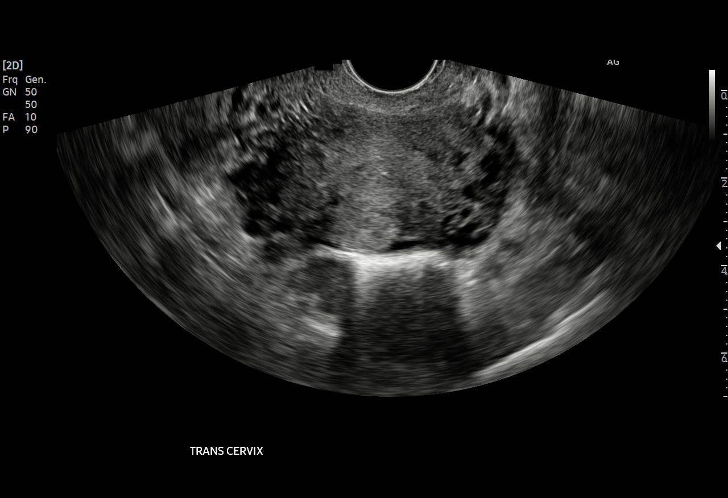
[im 25/55]
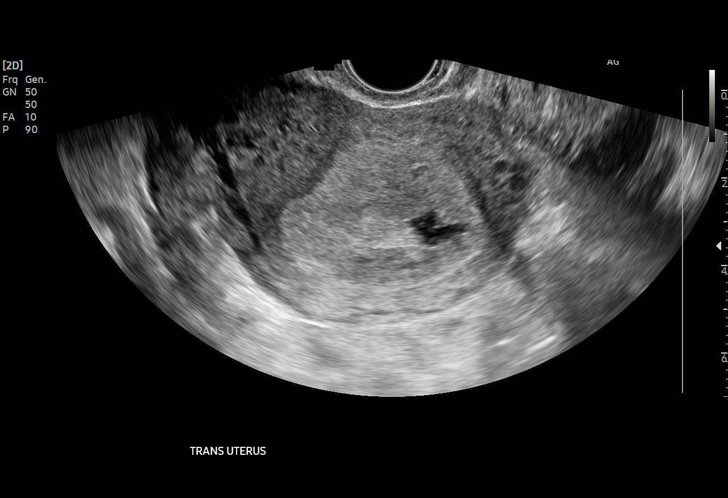
[im 29/55]
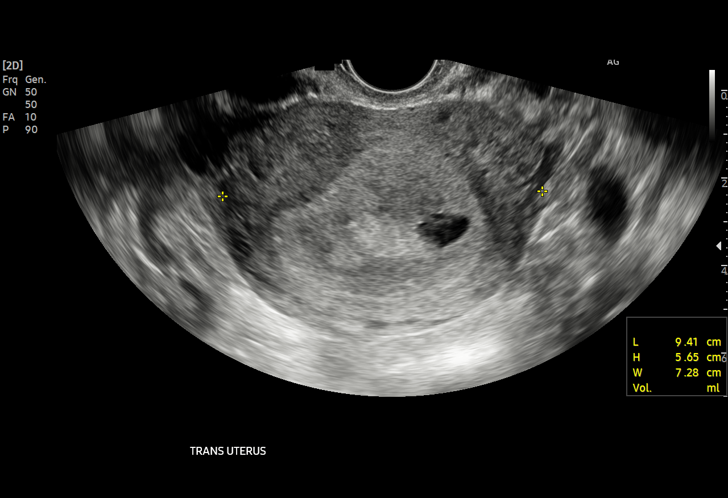
[im 31/55]
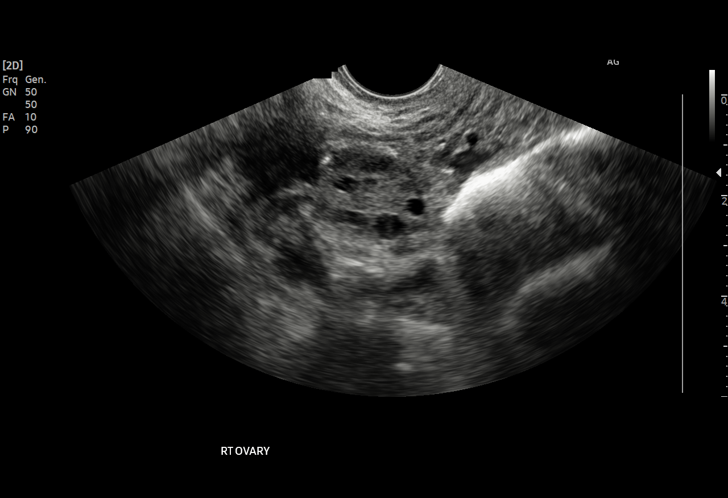
[im 35/55]
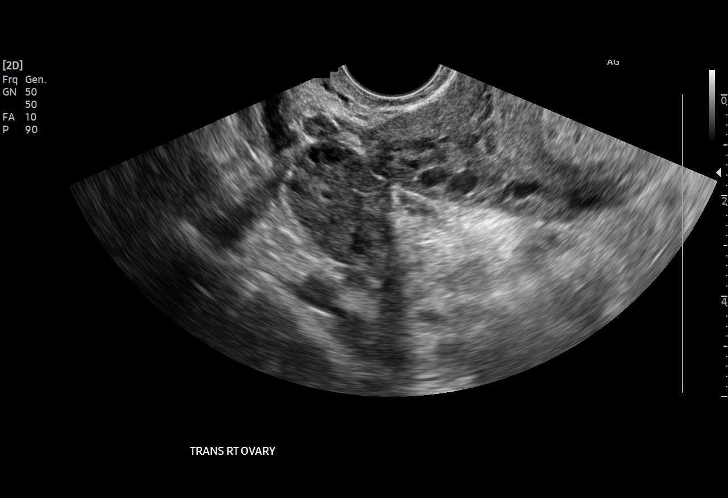
[im 39/55]
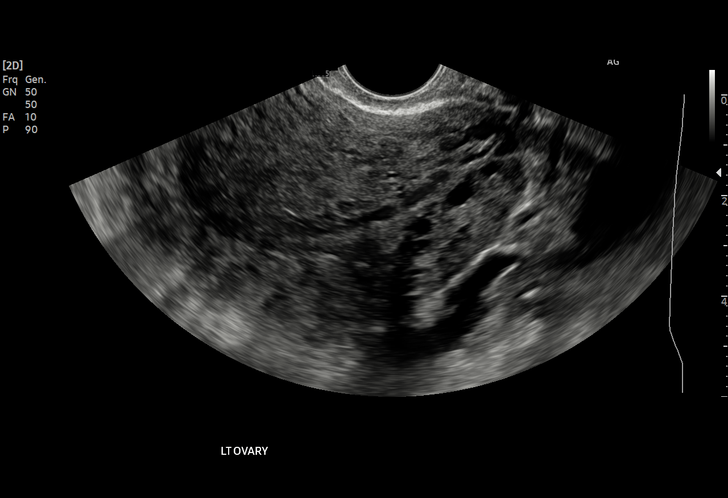
[im 43/55]
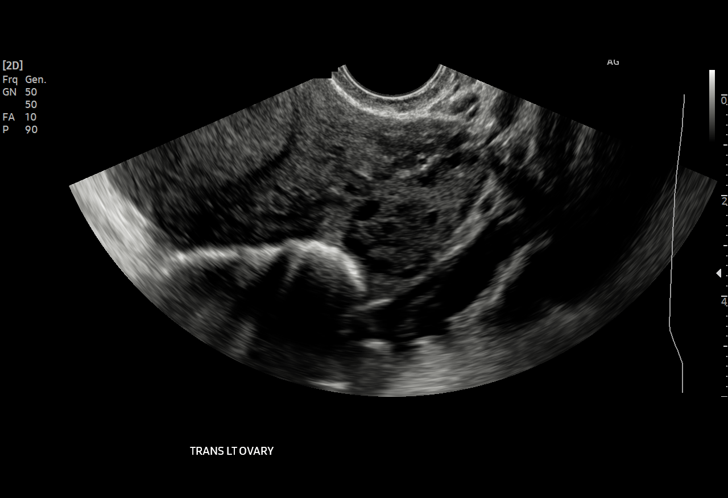
[im 47/55]
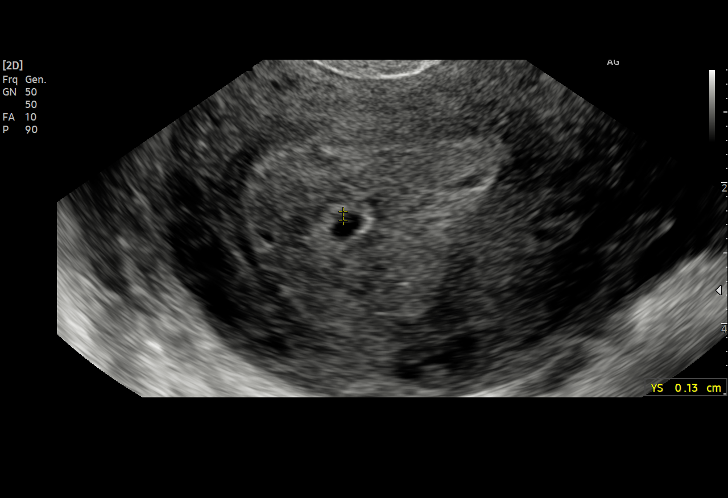
[im 51/55]
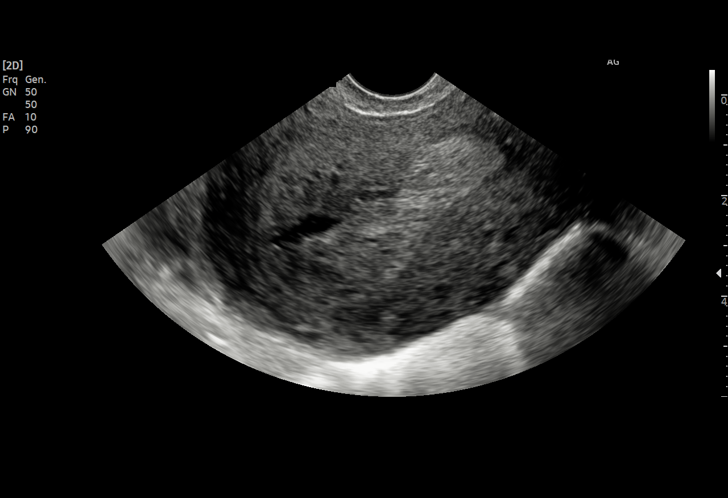
[im 55/55]
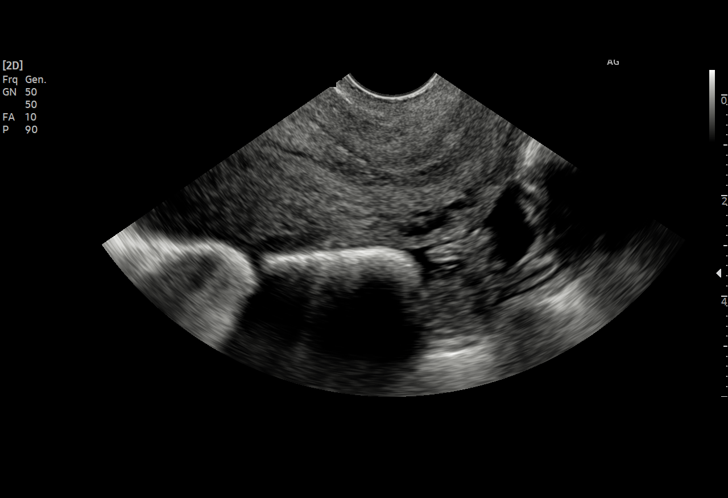

[15 of 28 positions shown; findings below may reference images not displayed]

FINDINGS: Intrauterine gestational sac: A small anechoic region is seen within
the endometrial canal within the uterine fundus with hyperechoic
surrounding likely decidual reaction suggesting an intrauterine
gestational sac.

Yolk sac:  A small yolk sac is likely present.

Embryo:  Not visualized

Cardiac Activity: Not applicable

Heart Rate: Not applicable

MSD: 4 mm   5 w   1 d

Subchorionic hemorrhage: A small subchorionic hematoma is seen
measuring 1.5 x 0.5 x 2.0 cm.

Maternal uterus/adnexae: The maternal uterus measures 10.0 x 5.1 x
7.8 cm. Bilateral maternal ovaries are within normal limits.

Color-flow doppler evaluation of both ovaries demonstrates normal
appearing low-resistance arterial and venous waveforms.

Mild free fluid within the pelvis.
IMPRESSION: 1. There is anechoic region that may represent a small intrauterine
gestational sac versus pseudogestational sac. A small likely yolk
sac is present. Differential considerations include early pregnancy
and failed pregnancy, and ectopic pregnancy cannot be excluded.
Recommend continued serial exams beta HCG and ultrasound follow-up.
2. Small subchorionic hematoma measuring up to 2.0 cm.

## 2022-09-30 NOTE — L&D Delivery Note (Signed)
Delivery Note   Cynthia Waters is a 27 y.o. G2P2002 at [redacted]w[redacted]d Estimated Date of Delivery: 10/24/22  PRE-OPERATIVE DIAGNOSIS:  1) [redacted]w[redacted]d pregnancy.    POST-OPERATIVE DIAGNOSIS:  1) [redacted]w[redacted]d pregnancy s/p Vaginal, Spontaneous    Delivery Type: Vaginal, Spontaneous    Delivery Anesthesia:  Epidural   Labor Complications:  none    ESTIMATED BLOOD LOSS: 255 ml    FINDINGS:   1) female infant, Apgar scores of 8   at 1 minute and 9   at 5 minutes and a birthweight of   ounces.    2) Nuchal cord: no  SPECIMENS:   PLACENTA:   Appearance:  intact, 3 vessel cord   Removal:    spontaneous   Disposition:  per protocol  DISPOSITION:  Infant to left in stable condition in the delivery room, with L&D personnel and mother,  NARRATIVE SUMMARY: Labor course:  Ms. Cynthia Waters is a [id number] at [redacted]w[redacted]d who presented for labor management.  She progressed well in labor without pitocin.  She received the appropriate anesthesia and proceeded to complete dilation. She evidenced good maternal expulsive effort during the second stage. She went on to deliver a viable infant. The placenta delivered without problems and was noted to be complete. A perineal and vaginal examination was performed. Episiotomy/Lacerations:  Bilateral upper labial abrasions, not in need of repair. The patient tolerated this well.  Philip Aspen, CNM  10/21/2022 6:25 AM

## 2022-10-02 ENCOUNTER — Encounter: Payer: Self-pay | Admitting: Obstetrics

## 2022-10-02 ENCOUNTER — Other Ambulatory Visit (HOSPITAL_COMMUNITY)
Admission: RE | Admit: 2022-10-02 | Discharge: 2022-10-02 | Disposition: A | Discharge status: Home / Self Care | Payer: BC Managed Care – PPO | Source: Ambulatory Visit | Attending: Obstetrics | Admitting: Obstetrics

## 2022-10-02 ENCOUNTER — Encounter: Payer: Self-pay | Admitting: Obstetrics and Gynecology

## 2022-10-02 ENCOUNTER — Ambulatory Visit (INDEPENDENT_AMBULATORY_CARE_PROVIDER_SITE_OTHER): Payer: BC Managed Care – PPO | Admitting: Obstetrics

## 2022-10-02 VITALS — BP 124/81 | HR 91 | Wt 166.0 lb

## 2022-10-02 DIAGNOSIS — Z3A36 36 weeks gestation of pregnancy: Secondary | ICD-10-CM | POA: Diagnosis not present

## 2022-10-02 DIAGNOSIS — Z3483 Encounter for supervision of other normal pregnancy, third trimester: Secondary | ICD-10-CM | POA: Diagnosis not present

## 2022-10-02 NOTE — Progress Notes (Signed)
ROB at [redacted]w[redacted]d. Denie ctx, LOF, and vaginal bleeding. Myeshia reports increased vaginal discharge without itching or odor. Getting ready for birth. Discussed when to go to the hospital. Prepared at home for new baby. Encouraged abdominal support belt and avoiding heavy lifting d/t hernia. GBS and GC/chlamydia self-collected today. RTC in one week.  Lloyd Huger, CNM

## 2022-10-04 LAB — CERVICOVAGINAL ANCILLARY ONLY
Bacterial Vaginitis (gardnerella): NEGATIVE
Candida Glabrata: NEGATIVE
Candida Vaginitis: NEGATIVE
Chlamydia: NEGATIVE
Comment: NEGATIVE
Comment: NEGATIVE
Comment: NEGATIVE
Comment: NEGATIVE
Comment: NORMAL
Neisseria Gonorrhea: NEGATIVE

## 2022-10-04 LAB — STREP GP B NAA: Strep Gp B NAA: POSITIVE — AB

## 2022-10-09 NOTE — Telephone Encounter (Signed)
Pt called triage and said in the last 12 hours she has been throwing up. She is able to keep fluids down, only thing she can't keep down is food. It takes a couple of hours after she eats to throw up. Per MMF, she should be fine, make sure she is drinking plenty of fluids. Safe meds sent via mychart. Pt did add she feels fine just wanted to double check.

## 2022-10-10 ENCOUNTER — Ambulatory Visit (INDEPENDENT_AMBULATORY_CARE_PROVIDER_SITE_OTHER): Payer: BC Managed Care – PPO | Admitting: Obstetrics and Gynecology

## 2022-10-10 ENCOUNTER — Encounter: Payer: Self-pay | Admitting: Obstetrics and Gynecology

## 2022-10-10 VITALS — BP 116/80 | HR 96 | Wt 163.6 lb

## 2022-10-10 DIAGNOSIS — Z3A38 38 weeks gestation of pregnancy: Secondary | ICD-10-CM

## 2022-10-10 DIAGNOSIS — Z3483 Encounter for supervision of other normal pregnancy, third trimester: Secondary | ICD-10-CM

## 2022-10-10 LAB — POCT URINALYSIS DIPSTICK OB
Bilirubin, UA: NEGATIVE
Blood, UA: NEGATIVE
Glucose, UA: NEGATIVE
Leukocytes, UA: NEGATIVE
Nitrite, UA: NEGATIVE
Spec Grav, UA: >=1.03 — AB (ref 1.010–1.025)
Urobilinogen, UA: 0.2 E.U./dL
pH, UA: 6 (ref 5.0–8.0)

## 2022-10-10 NOTE — Progress Notes (Signed)
ROB: Patient had contractions last night but they have resolved by this morning.  Signs and symptoms of labor discussed.  GBS positive discussed.  Cervix fingertip-50%-soft.

## 2022-10-10 NOTE — Progress Notes (Signed)
ROB. Patient states daily fetal movement with increased pressure. States last night she had contractions but nothing severe and woke up with no contractions. Would like cervical check today. Aware she is GBS +. Patient states no questions or concerns at this time.

## 2022-10-15 ENCOUNTER — Encounter: Payer: Self-pay | Admitting: Obstetrics and Gynecology

## 2022-10-17 ENCOUNTER — Telehealth: Payer: Self-pay

## 2022-10-17 ENCOUNTER — Encounter: Payer: BC Managed Care – PPO | Admitting: Obstetrics

## 2022-10-17 DIAGNOSIS — Z3483 Encounter for supervision of other normal pregnancy, third trimester: Secondary | ICD-10-CM

## 2022-10-17 NOTE — Telephone Encounter (Signed)
FMLA/DISABILITY form for [facility] filled out, signature obtained and faxed.

## 2022-10-18 ENCOUNTER — Ambulatory Visit (INDEPENDENT_AMBULATORY_CARE_PROVIDER_SITE_OTHER): Payer: Medicaid Other | Admitting: Advanced Practice Midwife

## 2022-10-18 ENCOUNTER — Encounter: Payer: Self-pay | Admitting: Advanced Practice Midwife

## 2022-10-18 VITALS — BP 120/73 | HR 86 | Wt 164.8 lb

## 2022-10-18 DIAGNOSIS — Z3483 Encounter for supervision of other normal pregnancy, third trimester: Secondary | ICD-10-CM

## 2022-10-18 DIAGNOSIS — Z3A39 39 weeks gestation of pregnancy: Secondary | ICD-10-CM

## 2022-10-18 DIAGNOSIS — Z3480 Encounter for supervision of other normal pregnancy, unspecified trimester: Secondary | ICD-10-CM

## 2022-10-18 LAB — POCT URINALYSIS DIPSTICK OB
Bilirubin, UA: NEGATIVE
Blood, UA: NEGATIVE
Glucose, UA: NEGATIVE
Ketones, UA: NEGATIVE
Nitrite, UA: NEGATIVE
POC,PROTEIN,UA: NEGATIVE
Spec Grav, UA: 1.01 (ref 1.010–1.025)
Urobilinogen, UA: 0.2 E.U./dL
pH, UA: 6 (ref 5.0–8.0)

## 2022-10-18 NOTE — Progress Notes (Signed)
Routine Prenatal Care Visit  Subjective  Cynthia Waters is a 27 y.o. G2P1001 at [redacted]w[redacted]d being seen today for ongoing prenatal care.  She is currently monitored for the following issues for this low-risk pregnancy and has Supervision of other normal pregnancy, antepartum; Limited prenatal care in second trimester; and LGSIL on Pap smear of cervix on their problem list.  ----------------------------------------------------------------------------------- Patient reports no complaints.   Contractions: Irritability. Vag. Bleeding: None.  Movement: Present. Leaking Fluid denies.  ----------------------------------------------------------------------------------- The following portions of the patient's history were reviewed and updated as appropriate: allergies, current medications, past family history, past medical history, past social history, past surgical history and problem list. Problem list updated.  Objective  Blood pressure 120/73, pulse 86, weight 164 lb 12.8 oz (74.8 kg), not currently breastfeeding. Pregravid weight 145 lb (65.8 kg) Total Weight Gain 19 lb 12.8 oz (8.981 kg) Urinalysis: Urine Protein Negative  Urine Glucose Negative  Fetal Status:     Movement: Present     General:  Alert, oriented and cooperative. Patient is in no acute distress.  Skin: Skin is warm and dry. No rash noted.   Cardiovascular: Normal heart rate noted  Respiratory: Normal respiratory effort, no problems with respiration noted  Abdomen: Soft, gravid, appropriate for gestational age. Pain/Pressure: Present     Pelvic:  Cervical exam deferred        Extremities: Normal range of motion.  Edema: None  Mental Status: Normal mood and affect. Normal behavior. Normal judgment and thought content.   Assessment   27 y.o. G2P1001 at [redacted]w[redacted]d by  10/24/2022, Date entered prior to episode creation presenting for routine prenatal visit  Plan    Term labor symptoms and general obstetric precautions including but not  limited to vaginal bleeding, contractions, leaking of fluid and fetal movement were reviewed in detail with the patient. Please refer to After Visit Summary for other counseling recommendations.   Return in about 1 week (around 10/25/2022) for rob.  Rod Can, CNM 10/18/2022 11:14 AM

## 2022-10-21 ENCOUNTER — Inpatient Hospital Stay
Admission: EM | Admit: 2022-10-21 | Discharge: 2022-10-23 | DRG: 807 | Disposition: A | Discharge status: Home / Self Care | Payer: Medicaid Other | Attending: Certified Nurse Midwife | Admitting: Certified Nurse Midwife

## 2022-10-21 ENCOUNTER — Encounter: Payer: Self-pay | Admitting: Certified Nurse Midwife

## 2022-10-21 ENCOUNTER — Inpatient Hospital Stay: Payer: Medicaid Other | Admitting: Anesthesiology

## 2022-10-21 DIAGNOSIS — O99824 Streptococcus B carrier state complicating childbirth: Secondary | ICD-10-CM | POA: Diagnosis present

## 2022-10-21 DIAGNOSIS — D62 Acute posthemorrhagic anemia: Secondary | ICD-10-CM | POA: Diagnosis not present

## 2022-10-21 DIAGNOSIS — Z3A39 39 weeks gestation of pregnancy: Secondary | ICD-10-CM

## 2022-10-21 DIAGNOSIS — O99820 Streptococcus B carrier state complicating pregnancy: Secondary | ICD-10-CM

## 2022-10-21 DIAGNOSIS — O9903 Anemia complicating the puerperium: Secondary | ICD-10-CM | POA: Diagnosis not present

## 2022-10-21 DIAGNOSIS — O26893 Other specified pregnancy related conditions, third trimester: Secondary | ICD-10-CM | POA: Diagnosis present

## 2022-10-21 LAB — TYPE AND SCREEN
ABO/RH(D): A POS
Antibody Screen: NEGATIVE

## 2022-10-21 LAB — CBC
HCT: 31.4 % — ABNORMAL LOW (ref 36.0–46.0)
Hemoglobin: 9.7 g/dL — ABNORMAL LOW (ref 12.0–15.0)
MCH: 25.1 pg — ABNORMAL LOW (ref 26.0–34.0)
MCHC: 30.9 g/dL (ref 30.0–36.0)
MCV: 81.1 fL (ref 80.0–100.0)
Platelets: 312 10*3/uL (ref 150–400)
RBC: 3.87 MIL/uL (ref 3.87–5.11)
RDW: 14.8 % (ref 11.5–15.5)
WBC: 10.2 10*3/uL (ref 4.0–10.5)
nRBC: 0 % (ref 0.0–0.2)

## 2022-10-21 LAB — HIV ANTIBODY (ROUTINE TESTING W REFLEX): HIV Screen 4th Generation wRfx: NONREACTIVE

## 2022-10-21 LAB — RPR: RPR Ser Ql: NONREACTIVE

## 2022-10-21 LAB — ABO/RH: ABO/RH(D): A POS

## 2022-10-21 MED ORDER — METHYLERGONOVINE MALEATE 0.2 MG/ML IJ SOLN: 0.2000 mg | INTRAMUSCULAR | Status: DC | PRN

## 2022-10-21 MED ORDER — SIMETHICONE 80 MG PO CHEW: 80.0000 mg | CHEWABLE_TABLET | ORAL | Status: DC | PRN

## 2022-10-21 MED ORDER — BENZOCAINE-MENTHOL 20-0.5 % EX AERO
1.0000 | INHALATION_SPRAY | CUTANEOUS | Status: DC | PRN
  Filled 2022-10-21: qty 56

## 2022-10-21 MED ORDER — SODIUM CHLORIDE 0.9 % IV SOLN
INTRAVENOUS | Status: AC
  Filled 2022-10-21: qty 5

## 2022-10-21 MED ORDER — ONDANSETRON HCL 4 MG/2ML IJ SOLN: 4.0000 mg | Freq: Four times a day (QID) | INTRAMUSCULAR | Status: DC | PRN

## 2022-10-21 MED ORDER — EPHEDRINE 5 MG/ML INJ: 10.0000 mg | INTRAVENOUS | Status: DC | PRN

## 2022-10-21 MED ORDER — COCONUT OIL OIL
1.0000 | TOPICAL_OIL | Status: DC | PRN
  Filled 2022-10-21: qty 22.5

## 2022-10-21 MED ORDER — LIDOCAINE HCL (PF) 1 % IJ SOLN: 30.0000 mL | INTRAMUSCULAR | Status: DC | PRN

## 2022-10-21 MED ORDER — PHENYLEPHRINE 80 MCG/ML (10ML) SYRINGE FOR IV PUSH (FOR BLOOD PRESSURE SUPPORT): 80.0000 ug | PREFILLED_SYRINGE | INTRAVENOUS | Status: DC | PRN

## 2022-10-21 MED ORDER — LIDOCAINE-EPINEPHRINE (PF) 1.5 %-1:200000 IJ SOLN
INTRAMUSCULAR | Status: DC | PRN
  Administered 2022-10-21: 3 mL via EPIDURAL

## 2022-10-21 MED ORDER — SENNOSIDES-DOCUSATE SODIUM 8.6-50 MG PO TABS
2.0000 | ORAL_TABLET | ORAL | Status: DC
  Administered 2022-10-21 – 2022-10-23 (×2): 2 via ORAL
  Filled 2022-10-21 (×3): qty 2

## 2022-10-21 MED ORDER — OXYCODONE-ACETAMINOPHEN 5-325 MG PO TABS: 1.0000 | ORAL_TABLET | ORAL | Status: DC | PRN

## 2022-10-21 MED ORDER — FENTANYL CITRATE (PF) 100 MCG/2ML IJ SOLN: 50.0000 ug | INTRAMUSCULAR | Status: DC | PRN

## 2022-10-21 MED ORDER — ONDANSETRON HCL 4 MG/2ML IJ SOLN
4.0000 mg | INTRAMUSCULAR | Status: DC | PRN
  Administered 2022-10-22: 4 mg via INTRAVENOUS
  Filled 2022-10-21: qty 2

## 2022-10-21 MED ORDER — DIPHENHYDRAMINE HCL 50 MG/ML IJ SOLN
12.5000 mg | Freq: Four times a day (QID) | INTRAMUSCULAR | Status: DC | PRN
  Administered 2022-10-21: 12.5 mg via INTRAVENOUS
  Filled 2022-10-21: qty 1

## 2022-10-21 MED ORDER — OXYCODONE-ACETAMINOPHEN 5-325 MG PO TABS: 2.0000 | ORAL_TABLET | ORAL | Status: DC | PRN

## 2022-10-21 MED ORDER — IBUPROFEN 600 MG PO TABS
600.0000 mg | ORAL_TABLET | Freq: Four times a day (QID) | ORAL | Status: DC
  Administered 2022-10-21 – 2022-10-22 (×7): 600 mg via ORAL
  Filled 2022-10-21 (×8): qty 1

## 2022-10-21 MED ORDER — LACTATED RINGERS IV SOLN: 500.0000 mL | Freq: Once | INTRAVENOUS | Status: DC

## 2022-10-21 MED ORDER — SOD CITRATE-CITRIC ACID 500-334 MG/5ML PO SOLN: 30.0000 mL | ORAL | Status: DC | PRN

## 2022-10-21 MED ORDER — FENTANYL-BUPIVACAINE-NACL 0.5-0.125-0.9 MG/250ML-% EP SOLN
12.0000 mL/h | EPIDURAL | Status: DC | PRN
  Administered 2022-10-21: 12 mL/h via EPIDURAL
  Filled 2022-10-21: qty 250

## 2022-10-21 MED ORDER — PENICILLIN G POT IN DEXTROSE 60000 UNIT/ML IV SOLN: 3.0000 10*6.[IU] | INTRAVENOUS | Status: DC

## 2022-10-21 MED ORDER — LACTATED RINGERS IV SOLN: INTRAVENOUS | Status: DC

## 2022-10-21 MED ORDER — OXYTOCIN-SODIUM CHLORIDE 30-0.9 UT/500ML-% IV SOLN: 2.5000 [IU]/h | INTRAVENOUS | Status: DC

## 2022-10-21 MED ORDER — DIPHENHYDRAMINE HCL 50 MG/ML IJ SOLN: 12.5000 mg | INTRAMUSCULAR | Status: DC | PRN

## 2022-10-21 MED ORDER — LACTATED RINGERS IV SOLN: 500.0000 mL | INTRAVENOUS | Status: DC | PRN

## 2022-10-21 MED ORDER — SODIUM CHLORIDE 0.9 % IV SOLN
INTRAVENOUS | Status: DC | PRN
  Administered 2022-10-21: 8 mL via EPIDURAL

## 2022-10-21 MED ORDER — ONDANSETRON HCL 4 MG PO TABS: 4.0000 mg | ORAL_TABLET | ORAL | Status: DC | PRN

## 2022-10-21 MED ORDER — TETANUS-DIPHTH-ACELL PERTUSSIS 5-2.5-18.5 LF-MCG/0.5 IM SUSY: 0.5000 mL | PREFILLED_SYRINGE | Freq: Once | INTRAMUSCULAR | Status: DC

## 2022-10-21 MED ORDER — ACETAMINOPHEN 325 MG PO TABS
650.0000 mg | ORAL_TABLET | ORAL | Status: DC | PRN
  Administered 2022-10-22: 650 mg via ORAL
  Filled 2022-10-21: qty 2

## 2022-10-21 MED ORDER — SODIUM CHLORIDE 0.9 % IV SOLN: 5.0000 10*6.[IU] | Freq: Once | INTRAVENOUS | Status: DC

## 2022-10-21 MED ORDER — PRENATAL MULTIVITAMIN CH
1.0000 | ORAL_TABLET | Freq: Every day | ORAL | Status: DC
  Administered 2022-10-21 – 2022-10-22 (×2): 1 via ORAL
  Filled 2022-10-21 (×2): qty 1

## 2022-10-21 MED ORDER — WITCH HAZEL-GLYCERIN EX PADS
1.0000 | MEDICATED_PAD | CUTANEOUS | Status: DC | PRN
  Administered 2022-10-23: 1 via TOPICAL
  Filled 2022-10-21 (×2): qty 100

## 2022-10-21 MED ORDER — FERROUS SULFATE 325 (65 FE) MG PO TABS
325.0000 mg | ORAL_TABLET | Freq: Every day | ORAL | Status: DC
  Administered 2022-10-21 – 2022-10-23 (×3): 325 mg via ORAL
  Filled 2022-10-21 (×3): qty 1

## 2022-10-21 MED ORDER — OXYTOCIN-SODIUM CHLORIDE 30-0.9 UT/500ML-% IV SOLN
INTRAVENOUS | Status: AC
  Filled 2022-10-21: qty 500

## 2022-10-21 MED ORDER — DIBUCAINE (PERIANAL) 1 % EX OINT
1.0000 | TOPICAL_OINTMENT | CUTANEOUS | Status: DC | PRN
  Filled 2022-10-21: qty 28

## 2022-10-21 MED ORDER — DOCUSATE SODIUM 100 MG PO CAPS
100.0000 mg | ORAL_CAPSULE | Freq: Two times a day (BID) | ORAL | Status: DC
  Administered 2022-10-22 (×2): 100 mg via ORAL
  Filled 2022-10-21 (×3): qty 1

## 2022-10-21 MED ORDER — METHYLERGONOVINE MALEATE 0.2 MG PO TABS: 0.2000 mg | ORAL_TABLET | ORAL | Status: DC | PRN

## 2022-10-21 MED ORDER — LIDOCAINE HCL (PF) 1 % IJ SOLN
INTRAMUSCULAR | Status: DC | PRN
  Administered 2022-10-21: 3 mL via SUBCUTANEOUS

## 2022-10-21 MED ORDER — OXYTOCIN BOLUS FROM INFUSION: 333.0000 mL | Freq: Once | INTRAVENOUS | Status: DC

## 2022-10-21 NOTE — Anesthesia Preprocedure Evaluation (Signed)
Anesthesia Evaluation  Patient identified by MRN, date of birth, ID band Patient awake    Reviewed: Allergy & Precautions, NPO status , Patient's Chart, lab work & pertinent test results  History of Anesthesia Complications Negative for: history of anesthetic complications  Airway Mallampati: II  TM Distance: >3 FB Neck ROM: Full    Dental no notable dental hx. (+) Teeth Intact   Pulmonary neg pulmonary ROS, neg sleep apnea, neg COPD, Patient abstained from smoking.Not current smoker   Pulmonary exam normal breath sounds clear to auscultation       Cardiovascular Exercise Tolerance: Good METS(-) hypertension(-) CAD and (-) Past MI negative cardio ROS (-) dysrhythmias  Rhythm:Regular Rate:Normal - Systolic murmurs    Neuro/Psych negative neurological ROS  negative psych ROS   GI/Hepatic ,neg GERD  ,,(+)     (-) substance abuse    Endo/Other  neg diabetes    Renal/GU negative Renal ROS     Musculoskeletal   Abdominal   Peds  Hematology  (+) Blood dyscrasia, anemia No bleeding disorders   Anesthesia Other Findings No past medical history on file.  Reproductive/Obstetrics (+) Pregnancy                             Anesthesia Physical Anesthesia Plan  ASA: 2  Anesthesia Plan: Epidural   Post-op Pain Management:    Induction:   PONV Risk Score and Plan: 2 and Treatment may vary due to age or medical condition and Ondansetron  Airway Management Planned: Natural Airway  Additional Equipment:   Intra-op Plan:   Post-operative Plan:   Informed Consent: I have reviewed the patients History and Physical, chart, labs and discussed the procedure including the risks, benefits and alternatives for the proposed anesthesia with the patient or authorized representative who has indicated his/her understanding and acceptance.       Plan Discussed with: Surgeon  Anesthesia Plan  Comments: (Discussed R/B/A of neuraxial anesthesia technique with patient: - rare risks of spinal/epidural hematoma, nerve damage, infection - Risk of PDPH - Risk of itching - Risk of nausea and vomiting - Risk of poor block necessitating replacement of epidural. - Risk of allergic reactions. Patient voiced understanding.)       Anesthesia Quick Evaluation

## 2022-10-21 NOTE — Progress Notes (Signed)
   10/21/22 1530  Fundal Assessment  Fundal Tone Firm  Fundal Position Right  Fundus U/E  Lochia  Color Rubra  Amount Scant  Odor None  Clots None   Gigi Gin, CNM notified of pt fundus to the right, scant bleeding, no clots, pt urinated recently 760ml. CNM states to continue to monitor, no new orders.

## 2022-10-21 NOTE — Anesthesia Procedure Notes (Signed)
Epidural Patient location during procedure: OB  Staffing Anesthesiologist: Arita Miss, MD Performed: anesthesiologist   Preanesthetic Checklist Completed: patient identified, IV checked, site marked, risks and benefits discussed, surgical consent, monitors and equipment checked, pre-op evaluation and timeout performed  Epidural Patient position: sitting Prep: ChloraPrep Patient monitoring: heart rate, continuous pulse ox and blood pressure Approach: midline Location: L3-L4 Injection technique: LOR saline  Needle:  Needle type: Tuohy  Needle gauge: 17 G Needle length: 9 cm Needle insertion depth: 4 cm Catheter type: closed end flexible Catheter size: 19 Gauge Catheter at skin depth: 9 cm Test dose: negative and 1.5% lidocaine with Epi 1:200 K  Assessment Sensory level: T10 Events: blood not aspirated, no cerebrospinal fluid, injection not painful, no injection resistance, no paresthesia and negative IV test  Additional Notes First/one attempt Pt. Evaluated and documentation done after procedure finished. Patient identified. Risks/Benefits/Options discussed with patient including but not limited to bleeding, infection, nerve damage, paralysis, failed block, incomplete pain control, headache, blood pressure changes, nausea, vomiting, reactions to medication both or allergic, itching and postpartum back pain. Confirmed with bedside nurse the patient's most recent platelet count. Confirmed with patient that they are not currently taking any anticoagulation, have any bleeding history or any family history of bleeding disorders. Patient expressed understanding and wished to proceed. All questions were answered. Sterile technique was used throughout the entire procedure. Please see nursing notes for vital signs. Test dose was given through epidural catheter and negative prior to continuing to dose epidural or start infusion. Warning signs of high block given to the patient including  shortness of breath, tingling/numbness in hands, complete motor block, or any concerning symptoms with instructions to call for help. Patient was given instructions on fall risk and not to get out of bed. All questions and concerns addressed with instructions to call with any issues or inadequate analgesia.     Patient tolerated the insertion well without immediate complications.  Reason for block: procedure for painReason for block:procedure for pain

## 2022-10-21 NOTE — Lactation Note (Signed)
This note was copied from a baby's chart. Lactation Consultation Note  Patient Name: Cynthia Waters JOACZ'Y Date: 10/21/2022 Reason for consult: Initial assessment;Term Age:27 hours  Maternal Data Has patient been taught Hand Expression?: Yes Does the patient have breastfeeding experience prior to this delivery?: No (did not bF beyond hospital with 70 month old)  P2, SVD 3 hours ago. No past medical history to note. Short interval pregnancy; 68 month old at home.   Feeding Mother's Current Feeding Choice: Breast Milk  Mom desires breastfeeding, but has parameters for introducing formula if needed: discomfort, low supply, and weight loss.  LATCH Score Latch: Repeated attempts needed to sustain latch, nipple held in mouth throughout feeding, stimulation needed to elicit sucking reflex.  Audible Swallowing: None  Type of Nipple: Everted at rest and after stimulation  Comfort (Breast/Nipple): Soft / non-tender  Hold (Positioning): Assistance needed to correctly position infant at breast and maintain latch.  LATCH Score: 6  LC at bedside to observe feeding. RN had previously assisted with position adjusted, and showing how to get a latch. Baby was on/off throughout time in the room- Mom appeared to have control of baby in football hold and sandwiching tissue. LC did recommend for mom to sandwich tissue and hold tissue still for baby to establish a rhythmic sucking pattern, and then slowly release tissue. Discussed importance for not pulling back on the tissue or to move the tissue to much- this would prevent milk flow, but also cause baby to slip to tip of nipple and cause discomfort.   After observation for almost 15 minutes; baby continuing to come on off, unsure of eating or pushing away; recommended for alternate soothing techniques and then maybe try again in a little while.  Lactation Tools Discussed/Used    Interventions Interventions: Breast feeding basics reviewed;Assisted  with latch;Hand express;Adjust position;Support pillows;Position options;Education (Newborn feeding patterns, early hunger cues, feeding on demand, Reviewed position, achieving a deep latch, signs of transfer, output expectations)  Encouraged to not use pacifier, discussed the impact this can have on establishing breast feeding, and the optimal pacifier to use if one is to be used.  Discharge    Consult Status Consult Status: Follow-up    Lavonia Drafts 10/21/2022, 9:54 AM

## 2022-10-21 NOTE — Lactation Note (Signed)
This note was copied from a baby's chart. Lactation Consultation Note  Patient Name: Cynthia Waters IFOYD'X Date: 10/21/2022 Reason for consult: Follow-up assessment;Mother's request;Term Age:27 hours  Maternal Data Has patient been taught Hand Expression?: Yes Does the patient have breastfeeding experience prior to this delivery?: No  Feeding Mother's Current Feeding Choice: Breast Milk Nipple Type: Extra Slow Flow  We attempted at breast first; baby having difficulties. Calmed baby, and then talked with mom about feeding options/choices and feeding pathways to ensure that her breastfeeding goals are met and baby is also fed.  Feeding plan: -Offer breast on demand; minimum every 3 hours -Allow baby to feed actively at breast, monitoring for hunger and fullness cues -Supplement if needed  Recommended pump set-up, and hand expression pre/post feeds for optimal milk removal/transfer. Mom verbalizes understanding.  Baby tolerated formula feed well, did spit at the end of feeding- mucous/undigested formula.   LATCH Score   Baby was difficult to latch, angry at the breast, arching back, and pushing. Would suckle for 2-5 spurts(during feeding attempts no swallows have been identified) and then scrunch face to scream out.  Lactation Tools Discussed/Used    Interventions Interventions: Assisted with latch;Hand express;Support pillows;Position options;Education;Pace feeding  Discharge    Consult Status Consult Status: Follow-up    Lavonia Drafts 10/21/2022, 3:21 PM

## 2022-10-21 NOTE — Lactation Note (Signed)
This note was copied from a baby's chart. Lactation Consultation Note  Patient Name: Cynthia Waters ENIDP'O Date: 10/21/2022 Reason for consult: Follow-up assessment;Mother's request;Term Age:27 hours  Maternal Data Has patient been taught Hand Expression?: Yes Does the patient have breastfeeding experience prior to this delivery?: No  Feeding Mother's Current Feeding Choice: Breast Milk  LATCH Score Latch: Repeated attempts needed to sustain latch, nipple held in mouth throughout feeding, stimulation needed to elicit sucking reflex.  Audible Swallowing: None  Type of Nipple: Everted at rest and after stimulation  Comfort (Breast/Nipple): Soft / non-tender  Hold (Positioning): Assistance needed to correctly position infant at breast and maintain latch.  LATCH Score: 6   Lactation Tools Discussed/Used    Interventions Interventions: Breast feeding basics reviewed;Assisted with latch;Support pillows;Position options;Education (frequent attempts, feeding behaviors, impact that spit/gagging can have on desire to feed)  Discharge    Consult Status Consult Status: Follow-up    Lavonia Drafts 10/21/2022, 1:21 PM

## 2022-10-21 NOTE — Discharge Summary (Signed)
Postpartum Discharge Summary  Date of Service updated***     Patient Name: Cynthia Waters DOB: 04-Nov-1995 MRN: [id number]  Date of admission: 10/21/2022 Delivery date:10/21/2022  Delivering provider: Philip Aspen  Date of discharge: *****  Admitting diagnosis: Labor and delivery, indication for care [O75.9] Intrauterine pregnancy: [redacted]w[redacted]d     Secondary diagnosis:  Principal Problem:   Labor and delivery, indication for care  Additional problems: GBS positive     Discharge diagnosis: Term Pregnancy Delivered                                              Post partum procedures:{Postpartum procedures:23558} Augmentation: N/A Complications: None  Hospital course: Onset of Labor With Vaginal Delivery      27 y.o. yo D5H2992 at [redacted]w[redacted]d was admitted in Active Labor on 10/21/2022. Labor course was complicated by GBS positive  Membrane Rupture Time/Date: 6:00 AM ,10/21/2022   Delivery Method:Vaginal, Spontaneous  Episiotomy: None  Lacerations:   bilateral labial abrasions, no repair needed Patient had a postpartum course complicated by ***.  She is ambulating, tolerating a regular diet, passing flatus, and urinating well. Patient is discharged home in stable condition on 10/21/22.  Newborn Data: Birth date:10/21/2022  Birth time:6:08 AM  Gender:Female  Living status:Living  Apgars:8 ,9  Weight:   Magnesium Sulfate received: No BMZ received: No Rhophylac:N/A MMR:No T-DaP:{Tdap:23962} Flu: {EQA:83419} Transfusion:No  Physical exam  Vitals:   10/21/22 0600 10/21/22 0605 10/21/22 0618 10/21/22 0621  BP:   121/71 120/70  Pulse:   90 92  Resp:      Temp:      TempSrc:      SpO2: 95% 98%    Weight:      Height:       General: {Exam; general:21111117} Lochia: {Desc; appropriate/inappropriate:30686::"appropriate"} Uterine Fundus: {Desc; firm/soft:30687} Incision: {Exam; incision:21111123} DVT Evaluation: {Exam; dvt:2111122} Labs: Lab Results  Component Value Date   WBC  10.2 10/21/2022   HGB 9.7 (L) 10/21/2022   HCT 31.4 (L) 10/21/2022   MCV 81.1 10/21/2022   PLT 312 10/21/2022      Latest Ref Rng & Units 02/22/2022    4:06 PM  CMP  Glucose 70 - 99 mg/dL 112   BUN 6 - 20 mg/dL 6   Creatinine 0.44 - 1.00 mg/dL 0.76   Sodium 135 - 145 mmol/L 137   Potassium 3.5 - 5.1 mmol/L 3.7   Chloride 98 - 111 mmol/L 105   CO2 22 - 32 mmol/L 23   Calcium 8.9 - 10.3 mg/dL 9.4   Total Protein 6.5 - 8.1 g/dL 7.9   Total Bilirubin 0.3 - 1.2 mg/dL 0.6   Alkaline Phos 38 - 126 U/L 40   AST 15 - 41 U/L 21   ALT 0 - 44 U/L 17    Edinburgh Score:    03/19/2022    8:43 AM  Edinburgh Postnatal Depression Scale Screening Tool  I have been able to laugh and see the funny side of things. 0  I have looked forward with enjoyment to things. 0  I have blamed myself unnecessarily when things went wrong. 0  I have been anxious or worried for no good reason. 0  I have felt scared or panicky for no good reason. 0  Things have been getting on top of me. 0  I have been so unhappy that  I have had difficulty sleeping. 0  I have felt sad or miserable. 0  I have been so unhappy that I have been crying. 0  The thought of harming myself has occurred to me. 0  Edinburgh Postnatal Depression Scale Total 0      After visit meds:  Allergies as of 10/21/2022   No Known Allergies   Med Rec must be completed prior to using this [facility]***        Discharge home in stable condition Infant Feeding: {Baby feeding:23562} Infant Disposition:{CHL IP OB HOME WITH IWPYKD:98338} Discharge instruction: per After Visit Summary and Postpartum booklet. Activity: Advance as tolerated. Pelvic rest for 6 weeks.  Diet: {OB diet:21111121} Anticipated Birth Control:  plans IUD Postpartum Appointment:2 weeks, video vist 6 wk PPV in office with Philip Aspen, CNM  Additional Postpartum F/U: Postpartum Depression checkup Future Appointments: Future Appointments  Date Time Provider  [place]  10/23/2022  8:35 AM Rubie Maid, MD [facility] None   Follow up Visit:      10/21/2022 Philip Aspen, CNM

## 2022-10-21 NOTE — H&P (Signed)
History and Physical   HPI  Cynthia Waters is a 27 y.o. G2P1001 at [redacted]w[redacted]d Estimated Date of Delivery: 10/24/22 who is being admitted for labor management   OB History  OB History  Gravida Para Term Preterm AB Living  2 1 1  0 0 1  SAB IAB Ectopic Multiple Live Births  0 0 0 0 1    # Outcome Date GA Lbr Len/2nd Weight Sex Delivery Anes PTL Lv  2 Current           1 Term 10/30/21     Vag-Spont   LIV    PROBLEM LIST  Pregnancy complications or risks: Patient Active Problem List   Diagnosis Date Noted   Labor and delivery, indication for care 10/21/2022   Limited prenatal care in second trimester 08/13/2022   Supervision of other normal pregnancy, antepartum 03/19/2022   LGSIL on Pap smear of cervix 12/11/2020    Prenatal labs and studies: ABO, Rh: A/Positive/-- (09/05 1100) Antibody: Negative (09/05 1100) Rubella: 4.53 (09/05 1100) RPR: Non Reactive (11/14 1552)  HBsAg: Negative (09/05 1100)  HIV: Non Reactive (09/05 1100)  XVQ:MGQQPYPP/-- (01/03 1222)   No past medical history on file.   Past Surgical History:  Procedure Laterality Date   OPEN REDUCTION INTERNAL FIXATION (ORIF) METACARPAL Left 06/14/2019   Procedure: OPEN REDUCTION INTERNAL FIXATION (ORIF) LEFT FIFTH METACARPAL;  Surgeon: Milly Jakob, MD;  Location: [place];  Service: Orthopedics;  Laterality: Left;  WITH PREOP BLOCK   WISDOM TOOTH EXTRACTION       Medications    Current Discharge Medication List     CONTINUE these medications which have NOT CHANGED   Details  Iron-FA-B Cmp-C-Biot-Probiotic (FUSION PLUS) CAPS Take 1 tablet by mouth daily. Qty: 90 capsule, Refills: 2    omeprazole (PRILOSEC) 10 MG capsule Take 10 mg by mouth daily.    Prenatal Vit-Fe Fumarate-FA (MULTIVITAMIN-PRENATAL) 27-0.8 MG TABS tablet Take 1 tablet by mouth daily at 12 noon.         Allergies  Patient has no known allergies.  Review of Systems  Constitutional:  negative Eyes: negative Ears, nose, mouth, throat, and face: negative Respiratory: negative Cardiovascular: negative Gastrointestinal: negative Genitourinary:negative Integument/breast: negative Hematologic/lymphatic: negative Musculoskeletal:negative Neurological: negative Behavioral/Psych: negative Endocrine: negative Allergic/Immunologic: negative  Physical Exam  BP 130/77   Pulse 77   Temp 98.2 F (36.8 C) (Oral)   Resp 16   Ht 5\' 3"  (1.6 m)   Wt 75 kg   LMP  (LMP Unknown)   BMI 29.29 kg/m   Lungs:  CTA B Cardio: RRR without M/R/G Abd: Soft, gravid, NT Presentation: cephalic EXT: No C/C/ 1+ Edema DTRs: 2+ B CERVIX: Dilation: 4.5 Effacement (%): 90 Station: -1 Presentation: Vertex Exam by:: JDaley  See Prenatal records for more detailed PE.     FHR:  Baseline: 130 bpm, Variability: Good {> 6 bpm), Accelerations: Reactive, and Decelerations: Absent  Toco: Uterine Contractions: Frequency: Every 2-3 minutes  Test Results  No results found for this or any previous visit (from the past 24 hour(s)). Group B Strep positive  Assessment   G2P1001 at [redacted]w[redacted]d Estimated Date of Delivery: 10/24/22  The fetus is reassuring.   Patient Active Problem List   Diagnosis Date Noted   Labor and delivery, indication for care 10/21/2022   Limited prenatal care in second trimester 08/13/2022   Supervision of other normal pregnancy, antepartum 03/19/2022   LGSIL on Pap smear of cervix 12/11/2020  Plan  1. Admit to L&D :   2. EFM:-- Category 1 3. Stadol or Epidural if desired.   4. Admission labs  5. Anticipate NSVD  Philip Aspen, CNM . 10/21/2022 4:01 AM

## 2022-10-22 LAB — CBC
HCT: 29.7 % — ABNORMAL LOW (ref 36.0–46.0)
Hemoglobin: 9.1 g/dL — ABNORMAL LOW (ref 12.0–15.0)
MCH: 25.1 pg — ABNORMAL LOW (ref 26.0–34.0)
MCHC: 30.6 g/dL (ref 30.0–36.0)
MCV: 82 fL (ref 80.0–100.0)
Platelets: 289 10*3/uL (ref 150–400)
RBC: 3.62 MIL/uL — ABNORMAL LOW (ref 3.87–5.11)
RDW: 15 % (ref 11.5–15.5)
WBC: 8.5 10*3/uL (ref 4.0–10.5)
nRBC: 0 % (ref 0.0–0.2)

## 2022-10-22 NOTE — Lactation Note (Signed)
This note was copied from a baby's chart. Lactation Consultation Note  Patient Name: Cynthia Waters KGURK'Y Date: 10/22/2022 Reason for consult: Follow-up assessment;Term Age:27 hours  Maternal Data This is mom's 2nd baby, SVD. Mom reports she breastfed her 1st child for 2.5 months and once she returned to work she found it too difficult to manage work and breastfeeding.  On follow-up today mom reports baby is agreeable to latch and latches well but after breastfeeding for 4-5 minutes he begins to get fussy at the first breast.She offers him the 2nd breast and again he is agreeable and with latch and breastfeed for 4-5 minutes and becomes fussy. Mom then supplements baby post breastfeeding with formula. Has patient been taught Hand Expression?: Yes Does the patient have breastfeeding experience prior to this delivery?: Yes How long did the patient breastfeed?: mom reports she breastfed for 2.5 months auntil she went back to work.  Feeding Mother's Current Feeding Choice: Breast Milk and Formula Nipple Type: Slow - flow   Interventions Interventions: Breast feeding basics reviewed;Breast massage;Education Discussed with mom baby has also been receiving formula bottles taking 8-23 ml's per feed post breastfeeding. Baby may be fussy at the breast as the milk is not flowing as freely as the bottle does. Encouraged mom to gently massage her breast during the feeding to encourage flow. Mom could consider switching baby back and forth more than once and potentially force another let-down, ie, right breast and once he gets fussy burp and left breast, and if he is fussy at the left after a few minutes switch him back to the right breast. If baby will not latch and feed at a feeding mom understands to request use of an electric breastpump. Discharge Pump: Personal  Consult Status Consult Status: Follow-up Date: 10/23/22 Follow-up type: In-patient  Update provided to care nurse.  Jonna Krislyn Donnan 10/22/2022, 5:02 PM

## 2022-10-22 NOTE — Progress Notes (Signed)
Subjective:   Cynthia Waters had a NSVB on 10/21/22. She was GBS positive with inadequate prophylaxis due to precipitous birth and needs to stay for 48 hrs, plans d/c tomorrow. Had epidural for pain management, EBL of 22ml, perineum intact, had bilateral upper labial abrasions that did not need repair. Has had routine postpartum care.  Pt. Is eating, hydrating, and voiding regularly without difficulty. Has had a BM which was not painful or difficult. Wants to breastfeed but was having difficulty with latching, has given some formula. Baby now seems fussy at the breast. States she wants to nurse for about 2 months. Has a 43month old at home who she nursed for 2 months but was also pumping and states she had low supply. Reports small amount of  vaginal bleeding, denies passing large blood clots. Has had cramping abdomen pain relieved with tylenol/ibuprofen. Plans to use paraguard for contraception. Denies anxiety/depression symptoms. Endorses good support from partner and family.   Objective:  Vital signs in last 24 hours: Temp:  [98 F (36.7 C)-98.7 F (37.1 C)] 98 F (36.7 C) (01/23 0804) Pulse Rate:  [65-82] 82 (01/23 0804) Resp:  [17-20] 20 (01/23 0804) BP: (109-119)/(67-77) 111/77 (01/23 0804) SpO2:  [98 %-100 %] 98 % (01/23 0804)    General: NAD Pulmonary: no increased work of breathing Breasts: soft, non-tender, nipples without breakdown Abdomen: soft, non-tender Fundus: firm, midline, at umbilicus Lochia: light rubra, no clots Perineum: no erythema or foul odor discharge, minimal edema Extremities: no edema, no erythema, no tenderness  Results for orders placed or performed during the hospital encounter of 10/21/22 (from the past 72 hour(s))  CBC     Status: Abnormal   Collection Time: 10/21/22  3:44 AM  Result Value Ref Range   WBC 10.2 4.0 - 10.5 K/uL   RBC 3.87 3.87 - 5.11 MIL/uL   Hemoglobin 9.7 (L) 12.0 - 15.0 g/dL   HCT 31.4 (L) 36.0 - 46.0 %   MCV 81.1 80.0 - 100.0  fL   MCH 25.1 (L) 26.0 - 34.0 pg   MCHC 30.9 30.0 - 36.0 g/dL   RDW 14.8 11.5 - 15.5 %   Platelets 312 150 - 400 K/uL   nRBC 0.0 0.0 - 0.2 %    Comment: Performed at [facility], [address] [place], [place] [address]  Type and screen [place]     Status: None   Collection Time: 10/21/22  3:44 AM  Result Value Ref Range   ABO/RH(D) A POS    Antibody Screen NEG    Sample Expiration      10/24/2022,2359 Performed at [facility], [address] [place], [place] [address]   RPR     Status: None   Collection Time: 10/21/22  3:44 AM  Result Value Ref Range   RPR Ser Ql NON REACTIVE NON REACTIVE    Comment: Performed at [facility], [address] [place], [place] [address]  HIV Antibody (routine testing w rflx)     Status: None   Collection Time: 10/21/22  6:43 AM  Result Value Ref Range   HIV Screen 4th Generation wRfx Non Reactive Non Reactive    Comment: Performed at [facility], [place] [address] [place],  [address]  ABO/Rh     Status: None   Collection Time: 10/21/22  6:43 AM  Result Value Ref Range   ABO/RH(D)      A POS Performed at [facility], [address] [place], [place] [address]   CBC     Status: Abnormal   Collection Time: 10/22/22  5:59 AM  Result Value Ref Range   WBC 8.5 4.0 - 10.5 K/uL   RBC 3.62 (L) 3.87 - 5.11 MIL/uL   Hemoglobin 9.1 (L) 12.0 - 15.0 g/dL   HCT 29.7 (L) 36.0 - 46.0 %   MCV 82.0 80.0 - 100.0 fL   MCH 25.1 (L) 26.0 - 34.0 pg   MCHC 30.6 30.0 - 36.0 g/dL   RDW 15.0 11.5 - 15.5 %   Platelets 289 150 - 400 K/uL   nRBC 0.0 0.0 - 0.2 %    Comment: Performed at [facility], [address], [place], [place] [address]    Assessment:   27 y.o. [phone] 1 day(s)  s/p NSVB Breastfeeding Anemia secondary to acute blood loss- hemodynamically stable and symptomatic VSS Pain well controlled  Plan:    PO Fe Blood Type --/--/A POS Performed at [facility], [address] [place], [place] [address]  [phone]) / Charlynn Grimes 4.53 (09/05 1100) / Varicella Immune Rhogam not indicated Tdap/varicella/rubella to be offered before discharge if indicated Feeding plan breast & bottle, lactation support Encouraged to continue breastfeeding, BF education on latch, position changes, cluster feeding, hunger cues, lactogenesis II, milk supply Lactation support from [facility] today Education given regarding options for contraception, as well as compatibility with breast feeding if applicable.  Patient plans on IUD for contraception. Continued routine postpartum care  Counseled on normal uterine involution and vaginal bleeding postpartum Anticipate discharge home tomorrow    Maggie Font, [place], [facility] 10/22/2022, 9:33 AM

## 2022-10-22 NOTE — Clinical Social Work Maternal (Signed)
  CLINICAL SOCIAL WORK MATERNAL/CHILD NOTE  Patient Details  Name: Cynthia Waters MRN: [id number] Date of Birth: 1996/09/15  Date:  10/22/2022  Clinical Social Worker Initiating Note:  Doran Clay RN BSN Case Manager Date/Time: Initiated:  10/22/22/0938     Child's Name:  Cynthia Waters   Biological Parents:  Mother, Father   Need for Interpreter:  None   Reason for Referral:  Other (Comment) [name] Score 10)   Address:  [address] [place] [address]    Phone number:  [phone] (home)     Additional phone number: NA  Household Members/Support Persons (HM/SP):   Household Member/Support Person 1, Household Member/Support Person 2   HM/SP Name Relationship DOB or Age  HM/SP -1 Cynthia Waters significant other 26  HM/SP -[place] son 11 months  HM/SP -3        HM/SP -4        HM/SP -5        HM/SP -6        HM/SP -7        HM/SP -8          Natural Supports (not living in the home):  Parent, Extended Family, Friends   Chiropodist:     Employment: Animator   Type of Work: Secondary school teacher   Education:  Forensic psychologist   Homebound arranged:    Museum/gallery curator Resources:  Medicaid   Other Resources:  [facility]   Cultural/Religious Considerations Which May Impact Care:  NA  Strengths:  Ability to meet basic needs  , Compliance with medical plan  , Home prepared for child  , Pediatrician chosen   Psychotropic Medications:         Pediatrician:    [employer]  Pediatrician List:   [place]      Pediatrician Fax Number:    Risk Factors/Current Problems:  None   Cognitive State:  Able to Concentrate  , Alert     Mood/Affect:  Comfortable  , Interested  , Happy     CSW Assessment: TOC consult for Edinburgh Score of 10.  RNCN called and spoke with patient via phone, introduced self and explained role for consult.   Patient engaged during conversation.  Lives at home with her significant other and 53 month old son Cynthia Waters.  She has no history of depression or anxiety, she does report having some depression after giving birth to her first son but she didn't really notice until she got pregnant this time.  Reviewed symptoms of postpartum depression, attached Postpartum Depression Progress Sheet to discharge instructions on AVS and Mental Health Resources for Therapy.  Encouraged patient to talk with her OB or pediatrician if experiencing symptoms.  She verbalizes understanding and has no questions or concerns at this time.    Patient and significant other have chosen a Lex[employer], [employer] and the home is prepared for the newborn.    CSW Plan/Description:  Perinatal Mood and Anxiety Disorder (PMADs) Education, No Further Intervention Required/No Barriers to Discharge    [name], RN 10/22/2022, 9:48 AM

## 2022-10-22 NOTE — Anesthesia Postprocedure Evaluation (Signed)
Anesthesia Post Note  Patient: Cynthia Waters  Procedure(s) Performed: AN AD HOC LABOR EPIDURAL  Patient location during evaluation: Mother Baby Anesthesia Type: Epidural Level of consciousness: awake and alert Pain management: pain level controlled Vital Signs Assessment: post-procedure vital signs reviewed and stable Respiratory status: spontaneous breathing, nonlabored ventilation and respiratory function stable Cardiovascular status: stable Postop Assessment: no headache, no backache, epidural receding and able to ambulate Anesthetic complications: no   No notable events documented.   Last Vitals:  Vitals:   10/21/22 2330 10/22/22 0355  BP: 115/68 112/72  Pulse: 78 75  Resp: 18 17  Temp: 36.8 C 36.9 C  SpO2: 99% 99%    Last Pain:  Vitals:   10/22/22 0355  TempSrc: Oral  PainSc: 0-No pain                 Lia Foyer

## 2022-10-23 ENCOUNTER — Telehealth: Payer: Self-pay | Admitting: Certified Nurse Midwife

## 2022-10-23 ENCOUNTER — Encounter: Payer: BC Managed Care – PPO | Admitting: Obstetrics and Gynecology

## 2022-10-23 MED ORDER — IBUPROFEN 600 MG PO TABS: 600.0000 mg | ORAL_TABLET | Freq: Four times a day (QID) | ORAL | 0 refills | Status: DC | PRN

## 2022-10-23 MED ORDER — ACETAMINOPHEN 325 MG PO TABS: 650.0000 mg | ORAL_TABLET | ORAL | Status: DC | PRN

## 2022-10-23 NOTE — Lactation Note (Signed)
This note was copied from a baby's chart. Lactation Consultation Note  Patient Name: Cynthia Waters [id number] Date: 10/23/2022 Reason for consult: Follow-up assessment;Term Age:27 hours  Maternal Data  This is mom's 2nd baby,SVD. No prior past medical history.  On follow up today for discharge, Mom denies any nipple pain and reports that breastfeeding is going well and baby latches well. Mom states baby gets fussy after several minutes at the breast because her "milk is not flowing fast enough for him." Mom has been breast and bottle feeding baby since birth.       Has patient been taught Hand Expression?: Yes Does the patient have breastfeeding experience prior to this delivery?: Yes How long did the patient breastfeed?:  (Mom staes she breastfed for 2.5 months until she went back to work)  Building surveyor Current Feeding Choice: Breast Milk and Formula Nipple Type: Slow - flow  A Feeding was observed and Supplemental Nutrition System was used to  increase flow and maintain latch at mom's breast. SNS worked well. Baby maintained latch and breastfed at both breast . Baby was content and Mom was satisfied with the use of the SNS as an option.  LATCH Score Latch: Grasps breast easily, tongue down, lips flanged, rhythmical sucking. (Baby appeared to be in flow preference due to the formula supplementation. Latched and relatched at the breast. Used SNS and baby had latched successfully)  Audible Swallowing: Spontaneous and intermittent  Type of Nipple: Everted at rest and after stimulation  Comfort (Breast/Nipple): Soft / non-tender  Hold (Positioning): No assistance needed to correctly position infant at breast.  LATCH Score: 10   Lactation Tools Discussed/Used Tools: Supplemental Nutrition System;Bottle   Discussed the use of an SNS , maintenance, and parts after care. Interventions Interventions: Assisted with latch;Skin to skin;Breast massage;Support pillows;Coconut  oil;Education  Educated mom on nipple stimulation via pumping, hand expression, and breast massage until breast milk comes in. Discussed engorgement and  breast care , signs of mastitis and when to notify PCP.Discussed warning signs for feeding infant and when to notify the Pediatrician. Outpatient resources and recommendations were given.   Discharge Discharge Education: Engorgement and breast care;Warning signs for feeding baby;Outpatient recommendation Pump: Personal  Consult Status Consult Status: Complete Date: 10/23/22 Follow-up type: In-patient  Update provided to Care Nurse.  [name] 10/23/2022, 11:56 AM

## 2022-10-23 NOTE — Progress Notes (Signed)
Patient discharged home with infant. Discharge instructions and prescriptions given and reviewed with patient. Patient verbalized understanding. Escorted out by volunteer.  

## 2022-10-23 NOTE — Progress Notes (Signed)
Patient ID: Cynthia Waters, female   DOB: [birth year], 27 y.o.   MRN: [id number]  Obstetric Postpartum Daily Progress Note Subjective:  27 y.o. G2P2002 postpartum day #2 status post vaginal delivery.  She is ambulating, is tolerating po, is voiding spontaneously.  Her pain is well controlled on PO pain medications. Her lochia is less than menses. Breastfeeding independently. Her partner is present, he will be home for the next 4 weeks. They have family and friends nearby.  Mood is good, admits to some PP anxiety with last baby.    Medications SCHEDULED MEDICATIONS   docusate sodium  100 mg Oral BID   ferrous sulfate  325 mg Oral Q breakfast   ibuprofen  600 mg Oral Q6H   oxytocin 40 units in LR 1000 mL  333 mL Intravenous Once   prenatal multivitamin  1 tablet Oral Q1200   senna-docusate  2 tablet Oral Q24H   Tdap  0.5 mL Intramuscular Once    MEDICATION INFUSIONS   fentaNYL 2 mcg/mL w/bupivacaine 0.125% in NS 250 mL Stopped (10/21/22 0636)   lactated ringers     lactated ringers     oxytocin 2.5 Units/hr (10/21/22 0636)    PRN MEDICATIONS  acetaminophen, benzocaine-Menthol, coconut oil, witch hazel-glycerin **AND** dibucaine, diphenhydrAMINE, ePHEDrine, ePHEDrine, fentaNYL (SUBLIMAZE) injection, fentaNYL 2 mcg/mL w/bupivacaine 0.125% in NS 250 mL, methylergonovine **OR** methylergonovine, ondansetron **OR** ondansetron (ZOFRAN) IV, oxyCODONE-acetaminophen, oxyCODONE-acetaminophen, phenylephrine, phenylephrine, simethicone    Objective:   Vitals:   10/22/22 0804 10/22/22 1528 10/22/22 2031 10/23/22 0800  BP: 111/77 108/78 110/80 105/70  Pulse: 82 80 75 78  Resp: 20 20 18 16   Temp: 98 F (36.7 C) 98.2 F (36.8 C) 98.2 F (36.8 C) 98.2 F (36.8 C)  TempSrc: Oral Oral Oral Oral  SpO2: 98% 99% 99% 98%  Weight:      Height:        Current Vital Signs 24h Vital Sign Ranges  T 98.2 F (36.8 C) Temp  Avg: 98.2 F (36.8 C)  Min: 98.2 F (36.8 C)  Max: 98.2 F (36.8 C)  BP 105/70  BP  Min: 105/70  Max: 110/80  HR 78 Pulse  Avg: 77.7  Min: 75  Max: 80  RR 16 Resp  Avg: 18  Min: 16  Max: 20  SaO2 98 %   SpO2  Avg: 98.7 %  Min: 98 %  Max: 99 %       24 Hour I/O Current Shift I/O  Time Ins Outs No intake/output data recorded. No intake/output data recorded.  General: NAD Pulmonary: no increased work of breathing Abdomen: non-distended, non-tender, fundus firm at level of umbilicus Extremities: no edema, no erythema, no tenderness  Labs:  Recent Labs  Lab 10/21/22 0344 10/22/22 0559  WBC 10.2 8.5  HGB 9.7* 9.1*  HCT 31.4* 29.7*  PLT 312 289     Assessment:   27 y.o. G2P2002 postpartum day # 2 status post SVB  Plan:   1) Acute blood loss anemia - hemodynamically stable and asymptomatic - po ferrous sulfate, pt has supplements at home.   2) A POS Performed at [facility], [address] [place], [place] [address]  / Charlynn Grimes 4.53 (09/05 1100)/ Varicella Immune  3) TDAP status ordered for discharge   4) breast /Contraception = IUD ParaGard   5) Disposition discharge later today   [place], CNM  10/23/2022 9:38 AM   Patient ID: Cynthia Waters, female   DOB: 1996/06/22, 27 y.o.  MRN: [id number]

## 2022-10-23 NOTE — Telephone Encounter (Signed)
Reached out to pt to schedule a 2 week pp video visit.  Pt needed to call back, busy at the time that I called.

## 2022-10-24 NOTE — Telephone Encounter (Signed)
Reached out to pt (2x) to schedule a 2 week pp video visit with [name].  Pt is scheduled with [name] on 11/08/2022.

## 2022-10-28 ENCOUNTER — Telehealth: Payer: Self-pay

## 2022-10-28 NOTE — Telephone Encounter (Signed)
Pt called the after hour nurse 10/26/22 7:26pm; she passed a blood clot; is about one week pp; clot was golf sized; not dizzy or lightheaded; no fever; vaginal delivery After hour nurse adv her to be seen.  [phone]  left msg to let us know how she is doing; MyChart is the better way.

## 2022-10-31 ENCOUNTER — Encounter: Payer: Self-pay | Admitting: Certified Nurse Midwife

## 2022-10-31 NOTE — Telephone Encounter (Signed)
Spoke with patient. She has been taking 1 Tylenol. Last dose 1.5 hours ago. Advised per Roberto Scales, CNM to take and additional Tylenol, 1 Benadryl and drink either a can of coke or a cup of coffee. If this doesn't help, she needs to report to ED for evaluation.

## 2022-11-05 NOTE — Telephone Encounter (Signed)
Pt has appt Fri; pt hasn't responded; msg closed.

## 2022-11-08 ENCOUNTER — Telehealth (INDEPENDENT_AMBULATORY_CARE_PROVIDER_SITE_OTHER): Payer: Medicaid Other | Admitting: Certified Nurse Midwife

## 2022-11-08 DIAGNOSIS — Z91199 Patient's noncompliance with other medical treatment and regimen due to unspecified reason: Secondary | ICD-10-CM

## 2022-11-08 NOTE — Progress Notes (Unsigned)
Edinburgh Postnatal Depression Scale - 11/08/22 1452       Edinburgh Postnatal Depression Scale:  In the Past 7 Days   I have been able to laugh and see the funny side of things. 0    I have looked forward with enjoyment to things. 1    I have blamed myself unnecessarily when things went wrong. 2    I have been anxious or worried for no good reason. 0    I have felt scared or panicky for no good reason. 2    Things have been getting on top of me. 2    I have been so unhappy that I have had difficulty sleeping. 1    I have felt sad or miserable. 1    I have been so unhappy that I have been crying. 1    The thought of harming myself has occurred to me. 0    Edinburgh Postnatal Depression Scale Total 10

## 2022-11-11 NOTE — Telephone Encounter (Signed)
Pt is scheduled with LMD on 11/13/2022 at 4:15 for the MyChart video visit.

## 2022-11-12 NOTE — Progress Notes (Signed)
Pt left virtual visit before talking with provider   Philip Aspen, CNM

## 2022-11-13 ENCOUNTER — Telehealth (INDEPENDENT_AMBULATORY_CARE_PROVIDER_SITE_OTHER): Payer: Medicaid Other | Admitting: Licensed Practical Nurse

## 2022-11-13 DIAGNOSIS — F53 Postpartum depression: Secondary | ICD-10-CM | POA: Diagnosis not present

## 2022-11-13 MED ORDER — SERTRALINE HCL 50 MG PO TABS: 50.0000 mg | ORAL_TABLET | Freq: Every day | ORAL | 1 refills | Status: DC

## 2022-11-13 NOTE — Progress Notes (Signed)
Virtual Visit via Video Note  I connected with Cynthia Waters on 11/13/22 at  4:15 PM EST by a video enabled telemedicine application and verified that I am speaking with the correct person using two identifiers.  Location: Patient: located in her car in her driveway in [place], [place] Provider: office in [place], [place]    I discussed the limitations of evaluation and management by telemedicine and the availability of in person appointments. The patient expressed understanding and agreed to proceed.  History of Present Illness: G2P2 SVB on 10/21/2022 with Cynthia Waters, female 3500g Perineum intact, no pregnancy or labor complications Pt pleased with birth experience "it was quick" Denies any concerns with voiding, stooling or perineum Sleep: she and her husband take shifts "I get good sleep" Bleeding has almost stopped  Has been going out, some walking  Feels that's her family is complete but not ready for a Tubal, desires a Paragard at 6 wks Her husband will go back to work on Monday but then will have some time off soon.  Stopped breastfeeding, tried pumping but then her pump broke "it just was not working out for [place]".  Mood: pt admits she has been feeling "all over the place" she felt this way after the birth of her first child, at that time she "just dealt with it". Would like to try Zoloft, denies any Si/HI, denies any hx of Bipolar or schizophrenia   their family is not local, does not have a lot extra support. Is not interested in Therapy as she does not know how she would make apts and get childcare covered.   Observations/Objective: Pt appears well rested, NAD  Last pap 04/2022 NILM  Assessment and Plan: Postpartum visit at 2.5wks Postpartum Depression   Follow Up Instructions: -Ok to increase physical activity  -PPD; Will start Zoloft 61m daily x 1 week and then increase to 542mdaily, will need a video follow up in 4-6 weeks  -6wk visit scheduled on 2/29, desires Paragard  insertion at that visit    I discussed the assessment and treatment plan with the patient. The patient was provided an opportunity to ask questions and all were answered. The patient agreed with the plan and demonstrated an understanding of the instructions.   The patient was advised to call back or seek an in-person evaluation if the symptoms worsen or if the condition fails to improve as anticipated.  I provided 15 minutes of non-face-to-face time during this encounter.   LYJillene BucksOMINIC, CNM

## 2022-11-28 ENCOUNTER — Ambulatory Visit (INDEPENDENT_AMBULATORY_CARE_PROVIDER_SITE_OTHER): Payer: Medicaid Other | Admitting: Certified Nurse Midwife

## 2022-11-28 ENCOUNTER — Encounter: Payer: Self-pay | Admitting: Certified Nurse Midwife

## 2022-11-28 VITALS — BP 130/86 | HR 66 | Resp 16 | Wt 149.3 lb

## 2022-11-28 DIAGNOSIS — Z30430 Encounter for insertion of intrauterine contraceptive device: Secondary | ICD-10-CM | POA: Diagnosis not present

## 2022-11-28 DIAGNOSIS — Z3202 Encounter for pregnancy test, result negative: Secondary | ICD-10-CM | POA: Diagnosis not present

## 2022-11-28 LAB — POCT URINE PREGNANCY: Preg Test, Ur: NEGATIVE

## 2022-11-28 NOTE — Progress Notes (Addendum)
   GYNECOLOGY OFFICE PROCEDURE NOTE  Cynthia Waters is a 27 y.o. [phone] here for para gaurd IUD insertion. No GYN concerns.   IUD Insertion Procedure Note Patient identified, informed consent performed, consent signed.   Discussed risks of irregular bleeding, cramping, infection, malpositioning or misplacement of the IUD outside the uterus which may require further procedure such as laparoscopy. Also discussed >99% contraception efficacy, increased risk of ectopic pregnancy with failure of method.   Emphasized that this did not protect against STIs, condoms recommended during all sexual encounters. Time out was performed.  Urine pregnancy test negative.  Speculum placed in the vagina.  Cervix visualized.  Cleaned with Betadine x 2.  Grasped anteriorly with a single tooth tenaculum.  Uterus sounded to 8 cm.  Para gaurd IUD placed per manufacturer's recommendations.  Strings trimmed to 3 cm. Tenaculum was removed, good hemostasis noted.  Patient tolerated procedure well.   Patient was given post-procedure instructions.  She was advised to have backup contraception for one week.  Patient was also asked to check IUD strings periodically and follow up in 4 weeks for IUD check.   Philip Aspen, CNM

## 2022-11-28 NOTE — Patient Instructions (Signed)

## 2022-11-28 NOTE — Progress Notes (Addendum)
Subjective:    Cynthia Waters is a 27 y.o. G65P2002 female who presents for a postpartum visit. She is 6 weeks postpartum following a spontaneous vaginal delivery at  term. Anesthesia: epidural. I have fully reviewed the prenatal and intrapartum course. Postpartum course has been normal. Baby's course has been normal. Baby is feeding by  bottle . Bleeding no bleeding. Bowel function is normal. Bladder function is normal. Patient is not sexually active. Last sexual activity: prior to delivery. Contraception method is IUD. Para guard placed today ( see note) .Postpartum depression screening: positive. Score 8.  PT is currently taking Zolft 50 mgs, declines increasing dose at this time Last pap 05/07/2022 and was negative.  The following portions of the patient's history were reviewed and updated as appropriate: allergies, current medications, past medical history, past surgical history and problem list.  Review of Systems Pertinent items are noted in HPI.   Vitals:   11/28/22 0928  BP: 130/86  Pulse: 66  Resp: 16  Weight: 149 lb 4.8 oz (67.7 kg)   Patient's last menstrual period was 11/19/2022 (exact date).  Objective:   General:  alert, cooperative and no distress   Breasts:  deferred, no complaints  Lungs: clear to auscultation bilaterally  Heart:  regular rate and rhythm  Abdomen: soft, nontender   Vulva: normal  Vagina: normal vagina  Cervix:  closed  Corpus: Well-involuted  Adnexa:  Non-palpable  Rectal Exam: no hemorrhoids        Assessment:   Postpartum exam 6 wks s/p SVD Bottle feeding Depression screening Contraception counseling   Plan:  : IUD Follow up in: 4 weeks for IUD string check or earlier if needed  Philip Aspen, CNM

## 2022-11-29 ENCOUNTER — Ambulatory Visit
Admission: RE | Admit: 2022-11-29 | Discharge: 2022-11-29 | Disposition: A | Discharge status: Home / Self Care | Payer: Medicaid Other | Source: Ambulatory Visit | Attending: Urgent Care | Admitting: Urgent Care

## 2022-11-29 VITALS — BP 114/73 | HR 72 | Temp 98.7°F | Resp 16

## 2022-11-29 DIAGNOSIS — R21 Rash and other nonspecific skin eruption: Secondary | ICD-10-CM | POA: Diagnosis not present

## 2022-11-29 DIAGNOSIS — J029 Acute pharyngitis, unspecified: Secondary | ICD-10-CM | POA: Diagnosis not present

## 2022-11-29 LAB — POCT RAPID STREP A (OFFICE): Rapid Strep A Screen: NEGATIVE

## 2022-11-29 NOTE — ED Provider Notes (Signed)
[facility]    CSN: [id number] Arrival date & time: 11/29/22  0850      History   Chief Complaint Chief Complaint  Patient presents with   Rash    Entered by patient    HPI Cynthia Waters is a 27 y.o. female.    Rash   Presents urgent care with complaint of rash starting Wednesday night (2 days ago).  She endorses "hives" on her back, her arms, her chest and endorses itchiness.  Patient states she started a new medication, Zoloft, 2 weeks ago and wonders if this is a reaction.  States she used Benadryl last night and her symptoms did not worsen but continues to be present.  Denies new contact with any other typical allergens-no new soaps, detergents, unwashed clothing, fragrances.  Denies fever or other systemic symptoms. She does endorse mild sore throat.  History reviewed. No pertinent past medical history.  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 Authorizing Provider  Iron-FA-B Cmp-C-Biot-Probiotic (FUSION PLUS) CAPS Take 1 tablet by mouth daily. 08/14/22  Yes Rubie Maid, MD  Prenatal Vit-Fe Fumarate-FA (MULTIVITAMIN-PRENATAL) 27-0.8 MG TABS tablet Take 1 tablet by mouth daily at 12 noon.   Yes [provider]  sertraline  (ZOLOFT) 50 MG tablet Take 1 tablet (50 mg total) by mouth daily. Take '25mg'$  (half a tablet) daily for 1 week then increase to '50mg'$  (1 tablet) daily 11/13/22  Yes Dominic, Nunzio Cobbs, CNM    Family History History reviewed. No pertinent family history.  Social History Social History   Tobacco Use   Smoking status: Never   Smokeless tobacco: Never  Vaping Use   Vaping Use: Never used  Substance Use Topics   Alcohol use: Not Currently    Comment: occasional   Drug use: No     Allergies   Patient has no known allergies.   Review of Systems Review of Systems  Skin:  Positive for rash.     Physical Exam Triage Vital Signs ED Triage Vitals  Enc Vitals Group     BP 11/29/22 0909 114/73     Pulse Rate 11/29/22 0909 72     Resp 11/29/22 0909 16     Temp 11/29/22 0909 98.7 F (37.1 C)     Temp Source 11/29/22 0909 Oral     SpO2 11/29/22 0909 97 %     Weight --      Height --      Head Circumference --  Peak Flow --      Pain Score 11/29/22 0916 0     Pain Loc --      Pain Edu? --      Excl. in [place]? --    No data found.  Updated Vital Signs BP 114/73 (BP Location: Left Arm)   Pulse 72   Temp 98.7 F (37.1 C) (Oral)   Resp 16   LMP 11/19/2022 (Exact Date)   SpO2 97%   Breastfeeding No   Visual Acuity Right Eye Distance:   Left Eye Distance:   Bilateral Distance:    Right Eye Near:   Left Eye Near:    Bilateral Near:     Physical Exam Vitals reviewed.  Constitutional:      Appearance: Normal appearance.  HENT:     Mouth/Throat:     Pharynx: Posterior oropharyngeal erythema present. No oropharyngeal exudate.     Tonsils: No tonsillar exudate.  Skin:    General: Skin is warm and dry.     Findings: Rash present. Rash is papular.     Comments: Use papular, sandpaperlike, rash on her anterior and posterior torso, and bilateral arms.  Neurological:     General: No focal deficit present.     Mental Status: She is alert and oriented to person, place,  and time.  Psychiatric:        Mood and Affect: Mood normal.      UC Treatments / Results  Labs (all labs ordered are listed, but only abnormal results are displayed) Labs Reviewed - No data to display  EKG   Radiology No results found.  Procedures Procedures (including critical care time)  Medications Ordered in UC Medications - No data to display  Initial Impression / Assessment and Plan / UC Course  I have reviewed the triage vital signs and the nursing notes.  Pertinent labs & imaging results that were available during my care of the patient were reviewed by me and considered in my medical decision making (see chart for details).   Patient is afebrile here without recent antipyretics. Satting well on room air. Overall is well appearing, well hydrated, without respiratory distress.  Diffuse papular, sandpaper-like, rash on bilateral arms, anterior and posterior torso.  Pharyngeal erythema is present with 3+ tonsils bilaterally.  Possibly some tonsillar exudate.  Rapid strep was obtained and is negative.  Discussed other possible causes of her rash including drug reaction to Zoloft.  Relatively low suspicion of this since she was tolerating this medication without reaction nearly 2 weeks.  She denies other sources of allergens.  Recommended she use Zyrtec twice daily and Benadryl at night.  Discuss possible reaction to Zoloft with her PCP.   Final Clinical Impressions(s) / UC Diagnoses   Final diagnoses:  None   Discharge Instructions   None    ED Prescriptions   None    PDMP not reviewed this encounter.   Rose Phi, [place] 11/29/22 [phone]

## 2022-11-29 NOTE — ED Triage Notes (Signed)
Rash/ small hives that started Wednesday night. On back, arm, chest that are itchy. Pt thinks it may be a reaction to the new medication she started 2 weeks ago Zoloft. Did take benadryl last night and rash/hives did not spread anymore but still there.

## 2022-11-29 NOTE — Discharge Instructions (Addendum)
Suggest using cetirizine twice a day. This is an OTC medication. You may use benadryl at night to help with itching and promote sleep.  Discuss possible reaction to zoloft with the prescribing provider.  Follow up here or with your primary care provider if your symptoms are worsening or not improving.

## 2022-11-30 ENCOUNTER — Ambulatory Visit
Admission: EM | Admit: 2022-11-30 | Discharge: 2022-11-30 | Disposition: A | Discharge status: Home / Self Care | Payer: Medicaid Other | Attending: Urgent Care | Admitting: Urgent Care

## 2022-11-30 ENCOUNTER — Encounter: Payer: Self-pay | Admitting: Emergency Medicine

## 2022-11-30 DIAGNOSIS — R238 Other skin changes: Secondary | ICD-10-CM | POA: Diagnosis not present

## 2022-11-30 MED ORDER — PREDNISONE 20 MG PO TABS: ORAL_TABLET | ORAL | 0 refills | Status: AC

## 2022-11-30 MED ORDER — AMOXICILLIN-POT CLAVULANATE 875-125 MG PO TABS: 1.0000 | ORAL_TABLET | Freq: Two times a day (BID) | ORAL | 0 refills | Status: DC

## 2022-11-30 NOTE — Discharge Instructions (Signed)
Follow up here or with your primary care provider if your symptoms are worsening or not improving with treatment.          

## 2022-11-30 NOTE — ED Provider Notes (Addendum)
Roderic Palau    CSN: [id number] Arrival date & time: 11/30/22  1249      History   Chief Complaint Chief Complaint  Patient presents with   Rash    Getting worse - Entered by patient    HPI Cynthia Waters is a 27 y.o. female.    Rash   Patient presents with itchy, diffuse, sandpaperlike rash across her entire body x 2 days.  She reports a new feeling of a lump in her throat starting last night.  Patient was seen yesterday for same symptoms.  She had negative rapid strep and strep culture is pending.  History reviewed. No pertinent past medical history.  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 Authorizing Provider  Iron-FA-B Cmp-C-Biot-Probiotic (FUSION PLUS) CAPS Take 1 tablet by mouth daily. 08/14/22   Rubie Maid, MD  Prenatal Vit-Fe Fumarate-FA (MULTIVITAMIN-PRENATAL) 27-0.8 MG TABS tablet Take 1 tablet by mouth daily at 12 noon.    [provider]  sertraline (ZOLOFT) 50 MG tablet Take 1 tablet (50 mg total) by mouth daily. Take '25mg'$  (half a tablet) daily for 1 week then increase to '50mg'$  (1 tablet) daily 11/13/22   Dominic, Nunzio Cobbs, CNM    Family History History reviewed. No pertinent family history.  Social History Social  History   Tobacco Use   Smoking status: Never   Smokeless tobacco: Never  Vaping Use   Vaping Use: Never used  Substance Use Topics   Alcohol use: Not Currently    Comment: occasional   Drug use: No     Allergies   Patient has no known allergies.   Review of Systems Review of Systems  Skin:  Positive for rash.     Physical Exam Triage Vital Signs ED Triage Vitals  Enc Vitals Group     BP 11/30/22 1354 130/86     Pulse Rate 11/30/22 1354 60     Resp 11/30/22 1354 16     Temp 11/30/22 1354 98.2 F (36.8 C)     Temp Source 11/30/22 1354 Oral     SpO2 11/30/22 1354 98 %     Weight --      Height --      Head Circumference --      Peak Flow --      Pain Score 11/30/22 1351 0     Pain Loc --      Pain Edu? --      Excl. in [place]? --  No data found.  Updated Vital Signs BP 130/86 (BP Location: Left Arm)   Pulse 60   Temp 98.2 F (36.8 C) (Oral)   Resp 16   LMP 11/19/2022 (Exact Date)   SpO2 98%   Visual Acuity Right Eye Distance:   Left Eye Distance:   Bilateral Distance:    Right Eye Near:   Left Eye Near:    Bilateral Near:     Physical Exam Vitals reviewed.  Constitutional:      Appearance: Normal appearance.  HENT:     Mouth/Throat:     Pharynx: Posterior oropharyngeal erythema present. No oropharyngeal exudate.     Tonsils: 3+ on the right.     Comments: She endorses enlarged tonsils at baseline, especially right side Skin:    General: Skin is warm and dry.  Neurological:     General: No focal deficit present.     Mental Status: She is alert and oriented to person, place, and time.  Psychiatric:        Mood and Affect: Mood normal.        Behavior: Behavior normal.      UC Treatments / Results  Labs (all labs ordered are listed, but only abnormal results are displayed) Labs Reviewed - No data to display  EKG   Radiology No results found.  Procedures Procedures (including critical care time)  Medications Ordered in  UC Medications - No data to display  Initial Impression / Assessment and Plan / UC Course  I have reviewed the triage vital signs and the nursing notes.  Pertinent labs & imaging results that were available during my care of the patient were reviewed by me and considered in my medical decision making (see chart for details).   Continues with generalized sandpaper like papular rash however now there are diffuse vesicular lesions present across her back and arms as well.  white appearing contents.  No pharyngeal erythema is present but without peritonsillar exudates.  Right tonsil is 3+, however she endorses enlarged tonsils at baseline.  Atopic dermatitis vs viral exanthem vs strep. Strep culture is still pending.  Will treat patient with Augmentin to cover possible strep infection while providing prednisone to calm the rash.  Asking her to follow-up with her primary care.   Final Clinical Impressions(s) / UC Diagnoses   Final diagnoses:  None   Discharge Instructions   None    ED Prescriptions   None    PDMP not reviewed this encounter.   Rose Phi, FNP 11/30/22 1409    Rose Phi, FNP 11/30/22 1412    Rose Phi, [place] 11/30/22 1413

## 2022-11-30 NOTE — ED Triage Notes (Signed)
Itchy, diffuse, sandpaper-like rash across entire body starting two days prior. Reports new feeling of lump in throat starting last night. Was evaluated for same yesterday.

## 2022-12-01 LAB — CULTURE, GROUP A STREP (THRC)

## 2022-12-03 ENCOUNTER — Encounter: Payer: Self-pay | Admitting: Certified Nurse Midwife

## 2022-12-06 ENCOUNTER — Other Ambulatory Visit: Payer: Self-pay | Admitting: Licensed Practical Nurse

## 2022-12-06 DIAGNOSIS — F53 Postpartum depression: Secondary | ICD-10-CM

## 2022-12-09 NOTE — Telephone Encounter (Signed)
Postpartum depression

## 2022-12-30 ENCOUNTER — Telehealth: Payer: Self-pay

## 2022-12-30 NOTE — Telephone Encounter (Signed)
[facility]- Discharge Call Backs-Left pt a VM about the following below. 1-Do you have any questions or concerns about yourself as you heal? 2-Any concerns or questions about your baby? 3-How was your stay at the hospital? 4-How did our team work together to care for you? You should be receiving a survey in the mail soon.   We would really appreciate it if you could fill that out for us and return it in the mail.  We value the feedback to make improvements and continue the great work we do.   If you have any questions please feel free to call me back at [phone]  

## 2023-01-14 NOTE — Progress Notes (Deleted)
   No chief complaint on file.    History of Present Illness:  Cynthia Waters is a 27 y.o. that had a Paragard IUD placed approximately 6 wks ago. Since that time, she denies dyspareunia, pelvic pain, non-menstrual bleeding, vaginal d/c, heavy bleeding.   Review of Systems  Physical Exam:  There were no vitals taken for this visit. There is no height or weight on file to calculate BMI.  Pelvic exam:  Two IUD strings {DESC; PRESENT/ABSENT:17923} seen coming from the cervical os. EGBUS, vaginal vault and cervix: within normal limits   Assessment:   No diagnosis found.  IUD strings present in proper location; pt doing well  Plan: F/u if any signs of infection or can no longer feel the strings.   Tuyet Bader B. Kannan Proia, PA-C 01/14/2023 4:33 PM

## 2023-01-16 ENCOUNTER — Ambulatory Visit: Payer: Medicaid Other | Admitting: Obstetrics and Gynecology

## 2023-01-16 DIAGNOSIS — Z30431 Encounter for routine checking of intrauterine contraceptive device: Secondary | ICD-10-CM

## 2023-01-20 ENCOUNTER — Ambulatory Visit: Payer: Medicaid Other | Admitting: Certified Nurse Midwife

## 2023-02-03 ENCOUNTER — Ambulatory Visit (INDEPENDENT_AMBULATORY_CARE_PROVIDER_SITE_OTHER): Payer: Medicaid Other | Admitting: Obstetrics

## 2023-02-03 ENCOUNTER — Other Ambulatory Visit (HOSPITAL_COMMUNITY)
Admission: RE | Admit: 2023-02-03 | Discharge: 2023-02-03 | Disposition: A | Discharge status: Home / Self Care | Payer: Medicaid Other | Source: Ambulatory Visit | Attending: Certified Nurse Midwife | Admitting: Certified Nurse Midwife

## 2023-02-03 ENCOUNTER — Encounter: Payer: Self-pay | Admitting: Obstetrics

## 2023-02-03 VITALS — BP 108/73 | HR 66 | Ht 63.0 in | Wt 156.0 lb

## 2023-02-03 DIAGNOSIS — Z30431 Encounter for routine checking of intrauterine contraceptive device: Secondary | ICD-10-CM | POA: Insufficient documentation

## 2023-02-03 DIAGNOSIS — N898 Other specified noninflammatory disorders of vagina: Secondary | ICD-10-CM

## 2023-02-03 NOTE — Progress Notes (Signed)
    GYNECOLOGY OFFICE ENCOUNTER NOTE  History:  27 y.o. [id number] here today for today for IUD check; Paragard  IUD was placed  11/28/22. Experiencing increased cramping, both pre-menstrual & random, left sided recently. Also increased vaginal discharge, white to yellow, mucousy.  The following portions of the patient's history were reviewed and updated as appropriate: allergies, current medications, past family history, past medical history, past social history, past surgical history and problem list. Last pap smear on 05/07/22 was normal.  Review of Systems:  Pertinent items are noted in HPI.  Objective:  Physical Exam Blood pressure 108/73, pulse 66, height 5\' 3"  (1.6 m), weight 156 lb (70.8 kg), last menstrual period 01/20/2023, not currently breastfeeding. CONSTITUTIONAL: Well-developed, well-nourished female in no acute distress.  NEUROLOGIC: Alert and oriented to person, place, and time. Normal reflexes, muscle tone coordination.  ABDOMEN: Soft, no distention noted.   PELVIC: Normal appearing external genitalia; normal appearing vaginal mucosa and cervix.  IUD strings visualized, about 3 cm in length outside cervix. Bimanual reveals negative CMT, uterus midline, nontender, mobile, smooth.  EXTREMITIES: Non-tender, no edema or cyanosis  Assessment & Plan:  IUD in place on exam, reviewed normal side effects. Swab collected to assess for infection, pelvic ultrasound ordered for reassurance given left sided pain. Would recommend removal if IUD not in appropriate location & this was reviewed with [facility]. She desires to keep Paragard as she is otherwise happy with this method.   Dominica Severin, CNM

## 2023-02-04 ENCOUNTER — Other Ambulatory Visit: Payer: Self-pay | Admitting: Obstetrics

## 2023-02-04 ENCOUNTER — Encounter: Payer: Self-pay | Admitting: Obstetrics

## 2023-02-04 LAB — CERVICOVAGINAL ANCILLARY ONLY
Bacterial Vaginitis (gardnerella): POSITIVE — AB
Candida Glabrata: NEGATIVE
Candida Vaginitis: NEGATIVE
Chlamydia: NEGATIVE
Comment: NEGATIVE
Comment: NEGATIVE
Comment: NEGATIVE
Comment: NEGATIVE
Comment: NEGATIVE
Comment: NORMAL
Neisseria Gonorrhea: NEGATIVE
Trichomonas: NEGATIVE

## 2023-02-04 MED ORDER — METRONIDAZOLE 500 MG PO TABS: 500.0000 mg | ORAL_TABLET | Freq: Two times a day (BID) | ORAL | 0 refills | Status: DC

## 2023-02-04 NOTE — Progress Notes (Signed)
+  BV. Rx for metronidazole 500 mg PO BID x 7 days sent to pharmacy. Darleen notified via MyChart.  M. Chryl Heck, CNM

## 2023-02-26 ENCOUNTER — Ambulatory Visit: Payer: Medicaid Other | Admitting: Obstetrics and Gynecology

## 2023-02-27 ENCOUNTER — Other Ambulatory Visit: Payer: Self-pay | Admitting: Obstetrics

## 2023-02-27 DIAGNOSIS — R102 Pelvic and perineal pain: Secondary | ICD-10-CM

## 2023-02-28 ENCOUNTER — Other Ambulatory Visit: Payer: Self-pay

## 2023-02-28 DIAGNOSIS — N760 Acute vaginitis: Secondary | ICD-10-CM

## 2023-02-28 MED ORDER — METRONIDAZOLE 500 MG PO TABS: 500.0000 mg | ORAL_TABLET | Freq: Two times a day (BID) | ORAL | 0 refills | Status: DC

## 2023-03-24 ENCOUNTER — Other Ambulatory Visit: Payer: Self-pay | Admitting: Obstetrics

## 2023-03-24 ENCOUNTER — Ambulatory Visit (INDEPENDENT_AMBULATORY_CARE_PROVIDER_SITE_OTHER): Payer: Medicaid Other

## 2023-03-24 DIAGNOSIS — Z30431 Encounter for routine checking of intrauterine contraceptive device: Secondary | ICD-10-CM | POA: Diagnosis not present

## 2023-03-24 DIAGNOSIS — R102 Pelvic and perineal pain: Secondary | ICD-10-CM

## 2023-04-10 ENCOUNTER — Ambulatory Visit (INDEPENDENT_AMBULATORY_CARE_PROVIDER_SITE_OTHER): Payer: Medicaid Other | Admitting: Obstetrics and Gynecology

## 2023-04-10 ENCOUNTER — Encounter: Payer: Self-pay | Admitting: Obstetrics and Gynecology

## 2023-04-10 ENCOUNTER — Ambulatory Visit: Payer: Medicaid Other | Admitting: Obstetrics

## 2023-04-10 VITALS — BP 102/74 | Ht 63.0 in | Wt 151.0 lb

## 2023-04-10 DIAGNOSIS — T8332XA Displacement of intrauterine contraceptive device, initial encounter: Secondary | ICD-10-CM

## 2023-04-10 DIAGNOSIS — Z30433 Encounter for removal and reinsertion of intrauterine contraceptive device: Secondary | ICD-10-CM | POA: Diagnosis not present

## 2023-04-10 DIAGNOSIS — Z30430 Encounter for insertion of intrauterine contraceptive device: Secondary | ICD-10-CM

## 2023-04-10 DIAGNOSIS — Z3202 Encounter for pregnancy test, result negative: Secondary | ICD-10-CM

## 2023-04-10 DIAGNOSIS — Z30432 Encounter for removal of intrauterine contraceptive device: Secondary | ICD-10-CM

## 2023-04-10 LAB — POCT URINE PREGNANCY: Preg Test, Ur: NEGATIVE

## 2023-04-10 MED ORDER — LEVONORGESTREL 20 MCG/DAY IU IUD
1.0000 | INTRAUTERINE_SYSTEM | Freq: Once | INTRAUTERINE | Status: AC
  Administered 2023-04-10: 1 via INTRAUTERINE

## 2023-04-10 NOTE — Patient Instructions (Signed)
I value your feedback and you entrusting us with your care. If you get a [facility] patient survey, I would appreciate you taking the time to let us know about your experience today. Thank you!  Instructions after IUD insertion  Most women experience no significant problems after insertion of an IUD, however minor cramping and spotting for a few days is common. Cramps may be treated with ibuprofen 800mg every 8 hours or Tylenol 650 mg every 4 hours. Contact [facility] immediately if you experience any of the following symptoms during the next week: temperature >99.6 degrees, worsening pelvic pain, abdominal pain, fainting, unusually heavy vaginal bleeding, foul vaginal discharge, or if you think you have expelled the IUD. Nothing inserted in the vagina for 48 hours. You will be scheduled for a follow up visit in approximately four weeks.    

## 2023-04-10 NOTE — Progress Notes (Addendum)
Patient, No Pcp Per   Chief Complaint  Patient presents with   IUD removal    Possible replacement today has qs first. 2 NEG UPT's at home    HPI:      Ms. Cynthia Waters is a 27 y.o. [id number] whose LMP was Patient's last menstrual period was 03/06/2023 (approximate)., presents today for f/u GYN u/s 03/24/23 showing malpositioned IUD. Paragard placed 11/28/22; pt having LLQ pain and cramping so u/s ordered. Pt has had neg UPTs at home.  Did OCPs in past without side effects. Likes not having hormonal BC but also with heavy and painful periods with Paragard.    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    Past Surgical History:  Procedure Laterality Date   OPEN REDUCTION INTERNAL FIXATION (ORIF) METACARPAL Left 06/14/2019   Procedure: OPEN REDUCTION INTERNAL FIXATION (ORIF) LEFT FIFTH METACARPAL;  Surgeon: Mack Hook, MD;  Location: [facility];  Service: Orthopedics;  Laterality: Left;  WITH PREOP BLOCK   WISDOM TOOTH EXTRACTION      Family History  Problem Relation Age of Onset   Breast cancer Mother        10/46    Social History   Socioeconomic History   Marital status: Significant Other    Spouse name: Not on file   Number of children: 1   Years of education: 61   Highest education level: Not on file  Occupational History   Occupation: Investment banker, corporate  Tobacco Use   Smoking status: Never   Smokeless tobacco: Never  Vaping Use   Vaping status: Never Used  Substance and Sexual Activity   Alcohol use: Not Currently    Comment: occasional   Drug use: No   Sexual activity: Yes    Partners: Male    Birth control/protection: I.U.D.    Comment: Paragard  Other Topics Concern   Not on file  Social History Narrative   Not on file   Social Determinants of Health   Financial Resource Strain: Low Risk  (03/19/2022)   Overall Financial Resource Strain (CARDIA)     Difficulty of Paying Living Expenses: Not very hard  Food Insecurity: No Food Insecurity (10/21/2022)   Hunger Vital Sign    Worried About Running Out of Food in the Last Year: Never true    Ran Out of Food in the Last Year: Never true  Transportation Needs: No Transportation Needs (10/21/2022)   PRAPARE - Administrator, Civil Service (Medical): No    Lack of Transportation (Non-Medical): No  Physical Activity: Insufficiently Active (03/19/2022)   Exercise Vital Sign    Days of Exercise per Week: 4 days    Minutes of Exercise per Session: 10 min  Stress: No Stress Concern Present (03/19/2022)   [employer] of Occupational Health - Occupational Stress Questionnaire    Feeling of Stress : Not at all  Social Connections: Moderately Isolated (03/19/2022)   Social Connection and Isolation Panel [NHANES]    Frequency of Communication with Friends and Family: More than three times a week    Frequency of Social Gatherings with Friends and Family: Once a week    Attends Religious Services: Never    Database administrator or Organizations: No    Attends Banker Meetings: Never    Marital Status: Living with partner  Intimate Partner Violence: Not At Risk (10/21/2022)  Humiliation, Afraid, Rape, and Kick questionnaire    Fear of Current or Ex-Partner: No    Emotionally Abused: No    Physically Abused: No    Sexually Abused: No    Outpatient Medications Prior to Visit  Medication Sig Dispense Refill   levonorgestrel (MIRENA) 20 MCG/DAY IUD 1 each by Intrauterine route once.     metroNIDAZOLE (FLAGYL) 500 MG tablet Take 1 tablet (500 mg total) by mouth 2 (two) times daily. 14 tablet 0   No facility-administered medications prior to visit.      ROS:  Review of Systems  Constitutional:  Negative for fever.  Gastrointestinal:  Negative for blood in stool, constipation, diarrhea, nausea and vomiting.  Genitourinary:  Negative for dyspareunia, dysuria, flank  pain, frequency, hematuria, urgency, vaginal bleeding, vaginal discharge and vaginal pain.  Musculoskeletal:  Negative for back pain.  Skin:  Negative for rash.   BREAST: No symptoms   OBJECTIVE:   Vitals:  BP 102/74   Ht 5\' 3"  (1.6 m)   Wt 151 lb (68.5 kg)   LMP 03/06/2023 (Approximate)   Breastfeeding No   BMI 26.75 kg/m   Physical Exam Vitals reviewed.  Constitutional:      Appearance: She is well-developed.  Pulmonary:     Effort: Pulmonary effort is normal.  Genitourinary:    General: Normal vulva.     Pubic Area: No rash.      Labia:        Right: No rash, tenderness or lesion.        Left: No rash, tenderness or lesion.      Vagina: Normal. No vaginal discharge, erythema, tenderness or bleeding.     Cervix: Normal.     Comments: IUD STRINGS IN CX OS Musculoskeletal:        General: Normal range of motion.     Cervical back: Normal range of motion.  Skin:    General: Skin is warm and dry.  Neurological:     General: No focal deficit present.     Mental Status: She is alert and oriented to person, place, and time.  Psychiatric:        Mood and Affect: Mood normal.        Behavior: Behavior normal.        Thought Content: Thought content normal.        Judgment: Judgment normal.     Results: Results for orders placed or performed in visit on 04/10/23 (from the past 24 hour(s))  POCT urine pregnancy     Status: Normal   Collection Time: 04/10/23  4:34 PM  Result Value Ref Range   Preg Test, Ur Negative Negative    IUD Removal Strings of IUD identified and grasped.  IUD removed without problem with ring forceps.  Pt tolerated this well.  IUD noted to be intact.  IUD Insertion Procedure Note Patient identified, informed consent performed, consent signed.   Discussed risks of irregular bleeding, cramping, infection, malpositioning or misplacement of the IUD outside the uterus which may require further procedure such as laparoscopy, risk of failure <1%.  Time out was performed.  Urine pregnancy test negative.  Speculum placed in the vagina.  Cervix visualized.  Cleaned with Betadine x 2.  Grasped anteriorly with a single tooth tenaculum.  Uterus sounded to 9.0 cm.   IUD placed per manufacturer's recommendations.  Strings trimmed to 3 cm. Tenaculum was removed, good hemostasis noted.  Patient tolerated procedure well.    Assessment/Plan: Malpositioned intrauterine device (  IUD), initial encounter--pt aware of u/s results. Discussed IUD placement and need for removal. Discussed BC options/pros/cons. Pt would like to try Mirena IUD for period improvement with IUD.   Encounter for IUD removal - Plan: POCT urine pregnancy  Encounter for IUD insertion - Plan: levonorgestrel (MIRENA) 20 MCG/DAY IUD 1 each  Patient was given post-procedure instructions.  She was advised to have backup contraception for one week.   Call if you are having increasing pain, cramps or bleeding or if you have a fever greater than 100.4 degrees F., shaking chills, nausea or vomiting. Patient was also asked to check IUD strings periodically and follow up in 4 weeks for IUD check.  Meds ordered this encounter  Medications   levonorgestrel (MIRENA) 20 MCG/DAY IUD 1 each      Return in about 5 weeks (around 05/15/2023) for IUD f/u.  Nellie Chevalier B. Ireene Ballowe, PA-C 04/10/2023 4:37 PM

## 2023-05-14 NOTE — Progress Notes (Deleted)
   No chief complaint on file.    History of Present Illness:  Cynthia Waters is a 27 y.o. that had a Mirena IUD placed approximately 1 month ago. Since that time, she denies dyspareunia, pelvic pain, non-menstrual bleeding, vaginal d/c, heavy bleeding.   Review of Systems  Physical Exam:  There were no vitals taken for this visit. There is no height or weight on file to calculate BMI.  Pelvic exam:  Two IUD strings {DESC; PRESENT/ABSENT:17923} seen coming from the cervical os. EGBUS, vaginal vault and cervix: within normal limits   Assessment:   No diagnosis found.  IUD strings present in proper location; pt doing well  Plan: F/u if any signs of infection or can no longer feel the strings.   Tia Hieronymus B. Betsi Crespi, PA-C 05/14/2023 8:16 PM

## 2023-05-15 ENCOUNTER — Ambulatory Visit: Payer: Medicaid Other | Admitting: Obstetrics and Gynecology

## 2023-05-15 DIAGNOSIS — Z30431 Encounter for routine checking of intrauterine contraceptive device: Secondary | ICD-10-CM

## 2023-06-09 NOTE — Progress Notes (Unsigned)
   No chief complaint on file.    History of Present Illness:  Cynthia Waters is a 27 y.o. that had a Mirena IUD placed approximately 7 weeks ago. Had malpositioned Paragard so changed to Mirena for flow improvement. Since that time, she denies dyspareunia, pelvic pain, non-menstrual bleeding, vaginal d/c, heavy bleeding.   Review of Systems  Physical Exam:  There were no vitals taken for this visit. There is no height or weight on file to calculate BMI.  Pelvic exam:  Two IUD strings {DESC; PRESENT/ABSENT:17923} seen coming from the cervical os. EGBUS, vaginal vault and cervix: within normal limits   Assessment:   No diagnosis found.  IUD strings present in proper location; pt doing well  Plan: F/u if any signs of infection or can no longer feel the strings.   Autrey Human B. Kirat Mezquita, PA-C 06/09/2023 1:45 PM

## 2023-06-10 ENCOUNTER — Encounter: Payer: Self-pay | Admitting: Obstetrics and Gynecology

## 2023-06-10 ENCOUNTER — Ambulatory Visit (INDEPENDENT_AMBULATORY_CARE_PROVIDER_SITE_OTHER): Payer: Medicaid Other | Admitting: Obstetrics and Gynecology

## 2023-06-10 VITALS — BP 105/70 | HR 62 | Ht 62.0 in | Wt 150.0 lb

## 2023-06-10 DIAGNOSIS — Z30431 Encounter for routine checking of intrauterine contraceptive device: Secondary | ICD-10-CM

## 2023-06-10 DIAGNOSIS — N921 Excessive and frequent menstruation with irregular cycle: Secondary | ICD-10-CM

## 2023-10-06 ENCOUNTER — Ambulatory Visit: Payer: Medicaid Other

## 2023-12-28 ENCOUNTER — Ambulatory Visit: Payer: Self-pay

## 2023-12-28 ENCOUNTER — Encounter: Payer: Self-pay | Admitting: Emergency Medicine

## 2023-12-28 ENCOUNTER — Other Ambulatory Visit: Payer: Self-pay

## 2023-12-28 ENCOUNTER — Ambulatory Visit
Admission: EM | Admit: 2023-12-28 | Discharge: 2023-12-28 | Disposition: A | Discharge status: Home / Self Care | Payer: Self-pay

## 2023-12-28 DIAGNOSIS — H109 Unspecified conjunctivitis: Secondary | ICD-10-CM

## 2023-12-28 DIAGNOSIS — H00021 Hordeolum internum right upper eyelid: Secondary | ICD-10-CM

## 2023-12-28 MED ORDER — ERYTHROMYCIN 5 MG/GM OP OINT: TOPICAL_OINTMENT | OPHTHALMIC | 0 refills | Status: DC

## 2023-12-28 NOTE — ED Provider Notes (Signed)
 Renaldo Fiddler    CSN: [id number] Arrival date & time: 12/28/23  0806      History   Chief Complaint Chief Complaint  Patient presents with   Eye Pain    HPI Cynthia Waters is a 28 y.o. female.   Patient presents for evaluation of right eye swelling and pain present for 7 days, over the last 24 hours has begun to experience Cynthia Waters puslike drainage with matting.  Stye in the inner corner, unsure of cause.  Has been has similar symptoms but his seems to be more allergic.  Denies visual disturbance, light sensitivity, injury or trauma.  Has attempted use of warm compresses but feels it may be worsening symptoms.  Denies use of contacts.  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 Authorizing Provider  erythromycin ophthalmic ointment Place a 1/2 inch ribbon of ointment into the eyelid every 6 hours for 7 days 12/28/23  Yes Eyob Godlewski, Elita Boone, NP  PARAGARD INTRAUTERINE COPPER IU by Intrauterine route.   Yes [provider]  levonorgestrel (MIRENA) 20 MCG/DAY IUD 1 each by Intrauterine route once. 04/10/23   Copland, Ilona Sorrel, PA-C    Family History Family History  Problem  Relation Age of Onset   Breast cancer Mother        17/46    Social History Social History   Tobacco Use   Smoking status: Never   Smokeless tobacco: Never  Vaping Use   Vaping status: Never Used  Substance Use Topics   Alcohol use: Not Currently    Comment: occasional   Drug use: No     Allergies   Patient has no known allergies.   Review of Systems Review of Systems  Eyes:  Positive for pain.     Physical Exam Triage Vital Signs ED Triage Vitals [12/28/23 0821]  Encounter Vitals Group     BP 126/81     Systolic BP Percentile      Diastolic BP Percentile      Pulse Rate 72     Resp 18     Temp 98.6 F (37 C)     Temp Source Oral     SpO2 98 %     Weight      Height      Head Circumference      Peak Flow      Pain  Score 5     Pain Loc      Pain Education      Exclude from Growth Chart    No data found.  Updated Vital Signs BP 126/81 (BP Location: Left Arm)   Pulse 72   Temp 98.6 F (37 C) (Oral)   Resp 18   SpO2 98%   Visual Acuity Right Eye Distance:   Left Eye Distance:   Bilateral Distance:    Right Eye Near:   Left Eye Near:    Bilateral Near:     Physical Exam Constitutional:      Appearance: Normal appearance.  Eyes:      Comments: Inner upper eyelid stye present to the right eye, mild right upper lid swelling, no drainage noted, vision grossly intact, extraocular movements intact  Neurological:     Mental Status: She is alert.      UC Treatments / Results  Labs (all labs ordered are listed, but only abnormal results are displayed) Labs Reviewed - No data to display  EKG   Radiology No results found.  Procedures Procedures (including critical care time)  Medications Ordered in UC Medications - No data to display  Initial Impression / Assessment and Plan / UC Course  I have reviewed the triage vital signs and the nursing notes.  Pertinent labs & imaging results that were available during my care of the patient  were reviewed by me and considered in my medical decision making (see chart for details).  Hordeolum internum of right upper eyelid, bacterial conjunctivitis of right eye  Stye noted on exam, discussed timeline for healing, patient has noticed new drainage within the last 24 hours most likely now having conjunctivitis as well, discussed, prescribed erythromycin ointment and recommended continue warm compresses, advised against eye rubbing and touching and discussed additional supportive care, may follow-up with his urgent care as needed Final Clinical Impressions(s) / UC Diagnoses   Final diagnoses:  Hordeolum internum of right upper eyelid  Bacterial conjunctivitis of right eye     Discharge Instructions      Discharged during lapse in computer system    ED Prescriptions     Medication Sig Dispense Auth. Provider   erythromycin ophthalmic ointment Place a 1/2 inch ribbon of ointment into the eyelid every 6 hours for 7 days 3.5 g Valinda Hoar, NP      PDMP not reviewed this encounter.   Valinda Hoar, [place] 12/28/23 [phone]

## 2023-12-28 NOTE — ED Triage Notes (Signed)
 PAtient presents to [facility] for evaluation of right eye pain and swelling x 1 week.  Patient states it started as a stye near the inner canthus, but warm compresses just seemed to be making it worse.  This morning, woke up and the eye was swollen shut with discharge.

## 2023-12-28 NOTE — Discharge Instructions (Addendum)
 Discharged during lapse in computer system

## 2024-03-08 ENCOUNTER — Encounter: Payer: Self-pay | Admitting: Licensed Practical Nurse

## 2024-03-12 ENCOUNTER — Ambulatory Visit: Payer: Self-pay

## 2024-03-12 VITALS — BP 108/58 | HR 89 | Ht 62.0 in | Wt 147.4 lb

## 2024-03-12 DIAGNOSIS — Z30431 Encounter for routine checking of intrauterine contraceptive device: Secondary | ICD-10-CM | POA: Diagnosis not present

## 2024-03-12 DIAGNOSIS — N898 Other specified noninflammatory disorders of vagina: Secondary | ICD-10-CM | POA: Diagnosis not present

## 2024-03-12 DIAGNOSIS — T8384XA Pain from genitourinary prosthetic devices, implants and grafts, initial encounter: Secondary | ICD-10-CM

## 2024-03-12 NOTE — Progress Notes (Signed)
   GYN ENCOUNTER  Encounter for IUD check  Subjective  HPI: Cynthia Waters is a 28 y.o. [id number] who presents today for IUD check. Has a Mirena  that was placed a little over a year ago. Has not had any issues until this time.   In last week, has started cramping more. Thought it might be her period, but periods have been lighter so she's not always sure. On Monday, still cramping after taking 3 Tylenol  and usually only needs 2 Tylenol  to manage. Also having back pain. Having uncomfortable penetration with sex and a little crampy afterwards.   Past Medical History:  Diagnosis Date   Anemia    Postpartum depression    Past Surgical History:  Procedure Laterality Date   OPEN REDUCTION INTERNAL FIXATION (ORIF) METACARPAL Left 06/14/2019   Procedure: OPEN REDUCTION INTERNAL FIXATION (ORIF) LEFT FIFTH METACARPAL;  Surgeon: Rober Chimera, MD;  Location: [facility];  Service: Orthopedics;  Laterality: Left;  WITH PREOP BLOCK   WISDOM TOOTH EXTRACTION     OB History     Gravida  2   Para  2   Term  2   Preterm      AB      Living  2      SAB      IAB      Ectopic      Multiple  0   Live Births  2          No Known Allergies  Review of Systems  12 point ROS negative except for pertinent positives noted in HPO above.   Objective  BP (!) 108/58   Pulse 89   Ht 5' 2 (1.575 m)   Wt 147 lb 6.4 oz (66.9 kg)   LMP  (LMP Unknown)   BMI 26.96 kg/m   Physical examination GENERAL APPEARANCE: alert, well appearing LUNGS: normal work of breathing HEART: normal heart rate Pelvic exam: normal external genitalia, vulva, vagina, cervix, uterus and adnexa, VULVA: normal appearing vulva with no masses, tenderness or lesions, VAGINA: normal appearing vagina with normal color and discharge, no lesions, CERVIX: normal appearing cervix, creamy white discharge noted, IUD strings visualized at cervical os.  Assessment/Plan - Vaginal swab collected to rule out  infection as source of discomfort. Has a history of BV that she treats with boric acid as needed.  - IUD strings visualized, IUD not seen at cervical os. Discussed utility of TVUS to determine IUD location at this time. Given this is a new onset and relatively mild symptoms that could be attributed to period-like cramping, will wait on US  at this time. Recommended use of NSAIDs for management of cramping.   Cynthia Waters, CNM  03/12/24 2:09 PM

## 2024-03-14 LAB — NUSWAB VG PLUS+MYCOPLASMAS,NAA
Candida albicans, NAA: NEGATIVE
Candida glabrata, NAA: NEGATIVE
Chlamydia trachomatis, NAA: NEGATIVE
Mycoplasma genitalium NAA: NEGATIVE
Mycoplasma hominis NAA: NEGATIVE
Neisseria gonorrhoeae, NAA: NEGATIVE
Trich vag by NAA: NEGATIVE
Ureaplasma spp NAA: POSITIVE — AB

## 2024-03-16 ENCOUNTER — Ambulatory Visit: Payer: Self-pay

## 2024-03-16 DIAGNOSIS — N760 Acute vaginitis: Secondary | ICD-10-CM

## 2024-03-16 MED ORDER — DOXYCYCLINE HYCLATE 100 MG PO CAPS: 100.0000 mg | ORAL_CAPSULE | Freq: Two times a day (BID) | ORAL | 0 refills | Status: AC
